# Patient Record
Sex: Female | Born: 1965 | Race: Black or African American | Hispanic: No | Marital: Single | Smoking: Former smoker
Health system: Southern US, Community
[De-identification: ages and names within clinical notes are randomized; demographics above are authoritative.]

## PROBLEM LIST (undated history)

## (undated) DIAGNOSIS — E119 Type 2 diabetes mellitus without complications: Secondary | ICD-10-CM

## (undated) DIAGNOSIS — I10 Essential (primary) hypertension: Secondary | ICD-10-CM

## (undated) DIAGNOSIS — Z87442 Personal history of urinary calculi: Secondary | ICD-10-CM

## (undated) DIAGNOSIS — E782 Mixed hyperlipidemia: Secondary | ICD-10-CM

## (undated) DIAGNOSIS — J302 Other seasonal allergic rhinitis: Secondary | ICD-10-CM

## (undated) HISTORY — DX: Type 2 diabetes mellitus without complications: E11.9

## (undated) HISTORY — DX: Mixed hyperlipidemia: E78.2

## (undated) HISTORY — PX: WISDOM TOOTH EXTRACTION: SHX21

## (undated) HISTORY — PX: TUBAL LIGATION: SHX77

## (undated) HISTORY — DX: Essential (primary) hypertension: I10

---

## 2010-05-02 ENCOUNTER — Emergency Department (HOSPITAL_COMMUNITY)
Admission: EM | Admit: 2010-05-02 | Discharge: 2010-05-02 | Payer: Self-pay | Source: Home / Self Care | Admitting: Emergency Medicine

## 2012-09-21 ENCOUNTER — Other Ambulatory Visit: Payer: Self-pay | Admitting: Obstetrics and Gynecology

## 2012-09-22 ENCOUNTER — Encounter (HOSPITAL_COMMUNITY): Payer: Self-pay | Admitting: Pharmacist

## 2012-09-23 ENCOUNTER — Encounter (HOSPITAL_COMMUNITY): Payer: Self-pay

## 2012-09-23 ENCOUNTER — Encounter (HOSPITAL_COMMUNITY)
Admission: RE | Admit: 2012-09-23 | Discharge: 2012-09-23 | Disposition: A | Discharge status: Home / Self Care | Payer: BC Managed Care – PPO | Source: Ambulatory Visit | Attending: Obstetrics and Gynecology | Admitting: Obstetrics and Gynecology

## 2012-09-23 DIAGNOSIS — Z01812 Encounter for preprocedural laboratory examination: Secondary | ICD-10-CM | POA: Insufficient documentation

## 2012-09-23 DIAGNOSIS — Z01818 Encounter for other preprocedural examination: Secondary | ICD-10-CM | POA: Insufficient documentation

## 2012-09-23 HISTORY — DX: Personal history of urinary calculi: Z87.442

## 2012-09-23 HISTORY — DX: Other seasonal allergic rhinitis: J30.2

## 2012-09-23 LAB — CBC
Hemoglobin: 11.6 g/dL — ABNORMAL LOW (ref 12.0–15.0)
MCHC: 31.3 g/dL (ref 30.0–36.0)
RBC: 4.48 MIL/uL (ref 3.87–5.11)

## 2012-09-23 LAB — SURGICAL PCR SCREEN: MRSA, PCR: POSITIVE — AB

## 2012-09-23 NOTE — Patient Instructions (Addendum)
   Your procedure is scheduled on: Thursday, May 29th  Enter through the Main Entrance of [facility] at: 6 am Pick up the phone at the desk and dial [phone] and inform us of your arrival.  Please call this number if you have any problems the morning of surgery: [phone]  Remember: Do not eat or drink after midnight: Wednesday Take these medicines the morning of surgery with a SIP OF WATER: None  Do not wear jewelry, make-up, or FINGER nail polish No metal in your hair or on your body. Do not wear lotions, powders, perfumes. You may wear deodorant.  Please use your CHG wash as directed prior to surgery.  Do not shave anywhere for at least 12 hours prior to first CHG shower.  Do not bring valuables to the hospital. Contacts, dentures or bridgework may not be worn into surgery.  Leave suitcase in the car. After Surgery it may be brought to your room. For patients being admitted to the hospital, checkout time is 11:00am the day of discharge.  Patients discharged on the day of surgery will not be allowed to drive home.  Home with sister Tammy.

## 2012-10-08 NOTE — H&P (Signed)
47 y.o. yo complains of severe dysmenorrhea.   From notes at office:"Pt referred by [facility] in [place] for a several yr h.o. worsening dysmenorrhea/ menorrhagia. Has treated this in past with OCPs/ Depo Provera. Had an u/s which revealed a normal endometrial strip and findings c/w adenomyosis. Pt has had a C/S secondary to preeclampsia and a BTL. Not currently sexually active. has normal GU/GI function."  Past Medical History  Diagnosis Date  . Seasonal allergies   . History of kidney stones     passes stone - no surgery required   Past Surgical History  Procedure Laterality Date  . Tubal ligation    . Cesarean section    . Wisdom tooth extraction      History   Social History  . Marital Status: Single    Spouse Name: N/A    Number of Children: N/A  . Years of Education: N/A   Occupational History  . Not on file.   Social History Main Topics  . Smoking status: Former Smoker -- 1.00 packs/day for 30 years    Types: Cigarettes    Quit date: 05/14/2012  . Smokeless tobacco: Never Used  . Alcohol Use: No  . Drug Use: No  . Sexually Active: Yes    Birth Control/ Protection: Surgical   Other Topics Concern  . Not on file   Social History Narrative  . No narrative on file    No current facility-administered medications on file prior to encounter.   No current outpatient prescriptions on file prior to encounter.    Allergies  Allergen Reactions  . Sulfa Antibiotics Hives    @VITALS2 @  Lungs: clear to ascultation Cor:  RRR Abdomen:  soft, nontender, nondistended. Ex:  no cords, erythema Pelvic: Vulva: no masses, atrophy, or lesions. Bladder/Urethra: no urethral discharge or mass and normal meatus and bladder non distended. Vagina no tenderness, erythema, cystocele, rectocele, abnormal vaginal discharge, or vesicle(s) or ulcers; sl narrow. Cervix: no discharge or cervical motion tenderness and grossly normal. Uterus: normal shape and size (8) and  midline, mobile, non-tender, and no uterine prolapse. Adnexa/Parametria: no parametrial tenderness or mass and no adnexal tenderness or ovarian mass.  [place] 12.9x6x8.  Adenomyosis.  No fibroids; ovaries normal  A:  Severe dysmenorrhea and menorrhagia not responsive to medical management.  Probable adenomyosis.  Desires definitive.   P:  For Robotic TLH/ salpingectomies/ possible BSO.   All risks, benefits and alternatives d/w patient and she desires to proceed.  Patient has undergone a modified bowel prep and will receive preop antibiotics and SCDs during the operation.     Finley Dinkel A

## 2012-10-09 ENCOUNTER — Encounter (HOSPITAL_COMMUNITY)
Admission: RE | Disposition: A | Discharge status: Home / Self Care | Payer: Self-pay | Source: Ambulatory Visit | Attending: Obstetrics and Gynecology

## 2012-10-09 ENCOUNTER — Encounter (HOSPITAL_COMMUNITY): Payer: Self-pay | Admitting: *Deleted

## 2012-10-09 ENCOUNTER — Encounter (HOSPITAL_COMMUNITY): Payer: Self-pay | Admitting: Anesthesiology

## 2012-10-09 ENCOUNTER — Ambulatory Visit (HOSPITAL_COMMUNITY)
Admission: RE | Admit: 2012-10-09 | Discharge: 2012-10-10 | Disposition: A | Discharge status: Home / Self Care | Payer: BC Managed Care – PPO | Source: Ambulatory Visit | Attending: Obstetrics and Gynecology | Admitting: Obstetrics and Gynecology

## 2012-10-09 ENCOUNTER — Ambulatory Visit (HOSPITAL_COMMUNITY): Payer: BC Managed Care – PPO | Admitting: Anesthesiology

## 2012-10-09 DIAGNOSIS — Z23 Encounter for immunization: Secondary | ICD-10-CM | POA: Insufficient documentation

## 2012-10-09 DIAGNOSIS — Z9889 Other specified postprocedural states: Secondary | ICD-10-CM

## 2012-10-09 DIAGNOSIS — N8000 Endometriosis of the uterus, unspecified: Secondary | ICD-10-CM | POA: Insufficient documentation

## 2012-10-09 DIAGNOSIS — N920 Excessive and frequent menstruation with regular cycle: Secondary | ICD-10-CM | POA: Insufficient documentation

## 2012-10-09 DIAGNOSIS — N946 Dysmenorrhea, unspecified: Secondary | ICD-10-CM | POA: Insufficient documentation

## 2012-10-09 HISTORY — PX: ROBOTIC ASSISTED TOTAL HYSTERECTOMY: SHX6085

## 2012-10-09 LAB — PREGNANCY, URINE: Preg Test, Ur: NEGATIVE

## 2012-10-09 SURGERY — ROBOTIC ASSISTED TOTAL HYSTERECTOMY
Anesthesia: General | Site: Abdomen | Wound class: Clean Contaminated

## 2012-10-09 MED ORDER — GLYCOPYRROLATE 0.2 MG/ML IJ SOLN
INTRAMUSCULAR | Status: AC
  Filled 2012-10-09: qty 3

## 2012-10-09 MED ORDER — ZOLPIDEM TARTRATE 5 MG PO TABS: 5.0000 mg | ORAL_TABLET | Freq: Every evening | ORAL | Status: DC | PRN

## 2012-10-09 MED ORDER — FENTANYL CITRATE 0.05 MG/ML IJ SOLN
INTRAMUSCULAR | Status: DC | PRN
  Administered 2012-10-09 (×2): 100 ug via INTRAVENOUS
  Administered 2012-10-09: 50 ug via INTRAVENOUS
  Administered 2012-10-09: 100 ug via INTRAVENOUS

## 2012-10-09 MED ORDER — NEOSTIGMINE METHYLSULFATE 1 MG/ML IJ SOLN
INTRAMUSCULAR | Status: AC
  Filled 2012-10-09: qty 1

## 2012-10-09 MED ORDER — PROPOFOL 10 MG/ML IV BOLUS
INTRAVENOUS | Status: DC | PRN
  Administered 2012-10-09: 170 mg via INTRAVENOUS
  Administered 2012-10-09: 60 mg via INTRAVENOUS
  Administered 2012-10-09: 30 mg via INTRAVENOUS

## 2012-10-09 MED ORDER — ONDANSETRON HCL 4 MG/2ML IJ SOLN
INTRAMUSCULAR | Status: AC
  Filled 2012-10-09: qty 2

## 2012-10-09 MED ORDER — NEOSTIGMINE METHYLSULFATE 1 MG/ML IJ SOLN
INTRAMUSCULAR | Status: DC | PRN
  Administered 2012-10-09: 5 mg via INTRAVENOUS

## 2012-10-09 MED ORDER — SCOPOLAMINE 1 MG/3DAYS TD PT72
MEDICATED_PATCH | TRANSDERMAL | Status: AC
  Filled 2012-10-09: qty 1

## 2012-10-09 MED ORDER — INDIGOTINDISULFONATE SODIUM 8 MG/ML IJ SOLN
INTRAMUSCULAR | Status: DC | PRN
  Administered 2012-10-09: 3 mL via INTRAVENOUS

## 2012-10-09 MED ORDER — ROCURONIUM BROMIDE 50 MG/5ML IV SOLN
INTRAVENOUS | Status: AC
  Filled 2012-10-09: qty 2

## 2012-10-09 MED ORDER — ROCURONIUM BROMIDE 100 MG/10ML IV SOLN
INTRAVENOUS | Status: DC | PRN
  Administered 2012-10-09: 10 mg via INTRAVENOUS
  Administered 2012-10-09: 50 mg via INTRAVENOUS
  Administered 2012-10-09: 10 mg via INTRAVENOUS
  Administered 2012-10-09: 20 mg via INTRAVENOUS
  Administered 2012-10-09: 10 mg via INTRAVENOUS

## 2012-10-09 MED ORDER — FENTANYL CITRATE 0.05 MG/ML IJ SOLN
25.0000 ug | INTRAMUSCULAR | Status: DC | PRN
  Administered 2012-10-09: 25 ug via INTRAVENOUS

## 2012-10-09 MED ORDER — OXYCODONE-ACETAMINOPHEN 5-325 MG PO TABS
1.0000 | ORAL_TABLET | ORAL | Status: DC | PRN
  Administered 2012-10-09: 2 via ORAL
  Administered 2012-10-10: 1 via ORAL
  Administered 2012-10-10: 2 via ORAL
  Filled 2012-10-09 (×3): qty 2

## 2012-10-09 MED ORDER — PROPOFOL 10 MG/ML IV EMUL
INTRAVENOUS | Status: AC
  Filled 2012-10-09: qty 20

## 2012-10-09 MED ORDER — MIDAZOLAM HCL 2 MG/2ML IJ SOLN
INTRAMUSCULAR | Status: AC
  Filled 2012-10-09: qty 2

## 2012-10-09 MED ORDER — KETOROLAC TROMETHAMINE 30 MG/ML IJ SOLN: 30.0000 mg | Freq: Four times a day (QID) | INTRAMUSCULAR | Status: DC

## 2012-10-09 MED ORDER — LIDOCAINE HCL (CARDIAC) 20 MG/ML IV SOLN
INTRAVENOUS | Status: DC | PRN
  Administered 2012-10-09: 80 mg via INTRAVENOUS

## 2012-10-09 MED ORDER — DEXAMETHASONE SODIUM PHOSPHATE 10 MG/ML IJ SOLN
INTRAMUSCULAR | Status: AC
  Filled 2012-10-09: qty 1

## 2012-10-09 MED ORDER — IBUPROFEN 800 MG PO TABS
800.0000 mg | ORAL_TABLET | Freq: Three times a day (TID) | ORAL | Status: DC | PRN
  Administered 2012-10-10: 800 mg via ORAL
  Filled 2012-10-09: qty 1

## 2012-10-09 MED ORDER — DEXAMETHASONE SODIUM PHOSPHATE 10 MG/ML IJ SOLN
INTRAMUSCULAR | Status: DC | PRN
  Administered 2012-10-09: 10 mg via INTRAVENOUS

## 2012-10-09 MED ORDER — CHLORHEXIDINE GLUCONATE CLOTH 2 % EX PADS
6.0000 | MEDICATED_PAD | Freq: Every day | CUTANEOUS | Status: DC
  Administered 2012-10-10: 6 via TOPICAL

## 2012-10-09 MED ORDER — OFLOXACIN 0.3 % OP SOLN: 1.0000 [drp] | Freq: Four times a day (QID) | OPHTHALMIC | Status: DC

## 2012-10-09 MED ORDER — ONDANSETRON HCL 4 MG/2ML IJ SOLN
INTRAMUSCULAR | Status: DC | PRN
  Administered 2012-10-09: 4 mg via INTRAVENOUS

## 2012-10-09 MED ORDER — MIDAZOLAM HCL 5 MG/5ML IJ SOLN
INTRAMUSCULAR | Status: DC | PRN
  Administered 2012-10-09: 2 mg via INTRAVENOUS

## 2012-10-09 MED ORDER — GLYCOPYRROLATE 0.2 MG/ML IJ SOLN
INTRAMUSCULAR | Status: AC
  Filled 2012-10-09: qty 1

## 2012-10-09 MED ORDER — MENTHOL 3 MG MT LOZG: 1.0000 | LOZENGE | OROMUCOSAL | Status: DC | PRN

## 2012-10-09 MED ORDER — MONTELUKAST SODIUM 10 MG PO TABS
10.0000 mg | ORAL_TABLET | Freq: Every day | ORAL | Status: DC
  Administered 2012-10-09: 10 mg via ORAL
  Filled 2012-10-09: qty 1

## 2012-10-09 MED ORDER — KETOROLAC TROMETHAMINE 0.5 % OP SOLN
1.0000 [drp] | Freq: Four times a day (QID) | OPHTHALMIC | Status: DC
  Administered 2012-10-09: 1 [drp] via OPHTHALMIC
  Filled 2012-10-09: qty 3

## 2012-10-09 MED ORDER — FENTANYL CITRATE 0.05 MG/ML IJ SOLN
INTRAMUSCULAR | Status: AC
  Filled 2012-10-09: qty 2

## 2012-10-09 MED ORDER — FENTANYL CITRATE 0.05 MG/ML IJ SOLN
INTRAMUSCULAR | Status: AC
  Administered 2012-10-09: 25 ug via INTRAVENOUS
  Filled 2012-10-09: qty 2

## 2012-10-09 MED ORDER — PNEUMOCOCCAL VAC POLYVALENT 25 MCG/0.5ML IJ INJ
0.5000 mL | INJECTION | INTRAMUSCULAR | Status: AC
  Administered 2012-10-10: 0.5 mL via INTRAMUSCULAR
  Filled 2012-10-09: qty 0.5

## 2012-10-09 MED ORDER — ACETAMINOPHEN 10 MG/ML IV SOLN
INTRAVENOUS | Status: AC
  Administered 2012-10-09: 1000 mg via INTRAVENOUS
  Filled 2012-10-09: qty 100

## 2012-10-09 MED ORDER — LIDOCAINE HCL (CARDIAC) 20 MG/ML IV SOLN
INTRAVENOUS | Status: AC
  Filled 2012-10-09: qty 5

## 2012-10-09 MED ORDER — ACETAMINOPHEN 10 MG/ML IV SOLN
INTRAVENOUS | Status: DC | PRN
  Administered 2012-10-09: 1000 mg via INTRAVENOUS

## 2012-10-09 MED ORDER — KETOROLAC TROMETHAMINE 30 MG/ML IJ SOLN
INTRAMUSCULAR | Status: AC
  Administered 2012-10-09: 30 mg via INTRAVENOUS
  Filled 2012-10-09: qty 1

## 2012-10-09 MED ORDER — BUPIVACAINE LIPOSOME 1.3 % IJ SUSP
20.0000 mL | Freq: Once | INTRAMUSCULAR | Status: DC
  Filled 2012-10-09: qty 20

## 2012-10-09 MED ORDER — ACETAMINOPHEN 10 MG/ML IV SOLN
1000.0000 mg | Freq: Once | INTRAVENOUS | Status: AC
  Administered 2012-10-09: 1000 mg via INTRAVENOUS

## 2012-10-09 MED ORDER — ONDANSETRON HCL 4 MG PO TABS: 4.0000 mg | ORAL_TABLET | Freq: Four times a day (QID) | ORAL | Status: DC | PRN

## 2012-10-09 MED ORDER — FENTANYL CITRATE 0.05 MG/ML IJ SOLN
INTRAMUSCULAR | Status: AC
  Filled 2012-10-09: qty 5

## 2012-10-09 MED ORDER — GLYCOPYRROLATE 0.2 MG/ML IJ SOLN
INTRAMUSCULAR | Status: DC | PRN
  Administered 2012-10-09: .8 mg via INTRAVENOUS

## 2012-10-09 MED ORDER — CEFAZOLIN SODIUM-DEXTROSE 2-3 GM-% IV SOLR
INTRAVENOUS | Status: AC
  Filled 2012-10-09: qty 50

## 2012-10-09 MED ORDER — ONDANSETRON HCL 4 MG/2ML IJ SOLN: 4.0000 mg | Freq: Four times a day (QID) | INTRAMUSCULAR | Status: DC | PRN

## 2012-10-09 MED ORDER — PHENYLEPHRINE HCL 10 MG/ML IJ SOLN
INTRAMUSCULAR | Status: DC | PRN
  Administered 2012-10-09: 40 ug via INTRAVENOUS
  Administered 2012-10-09: 80 ug via INTRAVENOUS
  Administered 2012-10-09: 40 ug via INTRAVENOUS

## 2012-10-09 MED ORDER — LACTATED RINGERS IV SOLN
INTRAVENOUS | Status: DC
  Administered 2012-10-09: 1000 mL via INTRAVENOUS
  Administered 2012-10-09 (×2): via INTRAVENOUS

## 2012-10-09 MED ORDER — LACTATED RINGERS IR SOLN
Status: DC | PRN
  Administered 2012-10-09: 3000 mL

## 2012-10-09 MED ORDER — CEFAZOLIN SODIUM-DEXTROSE 2-3 GM-% IV SOLR
2.0000 g | INTRAVENOUS | Status: AC
  Administered 2012-10-09: 2 g via INTRAVENOUS

## 2012-10-09 MED ORDER — KETOROLAC TROMETHAMINE 30 MG/ML IJ SOLN: 15.0000 mg | Freq: Once | INTRAMUSCULAR | Status: DC | PRN

## 2012-10-09 MED ORDER — ACETAMINOPHEN 10 MG/ML IV SOLN: 1000.0000 mg | Freq: Once | INTRAVENOUS | Status: AC

## 2012-10-09 MED ORDER — ACETAMINOPHEN 10 MG/ML IV SOLN
INTRAVENOUS | Status: AC
  Filled 2012-10-09: qty 100

## 2012-10-09 SURGICAL SUPPLY — 76 items
BAG URINE DRAINAGE (UROLOGICAL SUPPLIES) ×2 IMPLANT
BARRIER ADHS 3X4 INTERCEED (GAUZE/BANDAGES/DRESSINGS) ×2 IMPLANT
BENZOIN TINCTURE PRP APPL 2/3 (GAUZE/BANDAGES/DRESSINGS) ×2 IMPLANT
CATH FOLEY 3WAY  5CC 16FR (CATHETERS) ×1
CATH FOLEY 3WAY 5CC 16FR (CATHETERS) ×1 IMPLANT
CHLORAPREP W/TINT 26ML (MISCELLANEOUS) ×4 IMPLANT
CLOTH BEACON ORANGE TIMEOUT ST (SAFETY) ×2 IMPLANT
CONT PATH 16OZ SNAP LID 3702 (MISCELLANEOUS) ×2 IMPLANT
COVER MAYO STAND STRL (DRAPES) ×2 IMPLANT
COVER TABLE BACK 60X90 (DRAPES) ×4 IMPLANT
COVER TIP SHEARS 8 DVNC (MISCELLANEOUS) ×2 IMPLANT
COVER TIP SHEARS 8MM DA VINCI (MISCELLANEOUS) ×2
DECANTER SPIKE VIAL GLASS SM (MISCELLANEOUS) ×2 IMPLANT
DERMABOND ADVANCED (GAUZE/BANDAGES/DRESSINGS) ×1
DERMABOND ADVANCED .7 DNX12 (GAUZE/BANDAGES/DRESSINGS) ×1 IMPLANT
DRAPE HUG U DISPOSABLE (DRAPE) ×2 IMPLANT
DRAPE LG THREE QUARTER DISP (DRAPES) ×4 IMPLANT
DRAPE WARM FLUID 44X44 (DRAPE) ×2 IMPLANT
ELECT REM PT RETURN 9FT ADLT (ELECTROSURGICAL) ×2
ELECTRODE REM PT RTRN 9FT ADLT (ELECTROSURGICAL) ×1 IMPLANT
EVACUATOR SMOKE 8.L (FILTER) ×4 IMPLANT
GAUZE VASELINE 3X9 (GAUZE/BANDAGES/DRESSINGS) IMPLANT
GLOVE BIO SURGEON STRL SZ 6.5 (GLOVE) ×2 IMPLANT
GLOVE BIO SURGEON STRL SZ7 (GLOVE) ×4 IMPLANT
GLOVE BIOGEL PI IND STRL 6.5 (GLOVE) ×1 IMPLANT
GLOVE BIOGEL PI INDICATOR 6.5 (GLOVE) ×1
GLOVE ECLIPSE 6.5 STRL STRAW (GLOVE) ×8 IMPLANT
GLOVE ECLIPSE 7.0 STRL STRAW (GLOVE) ×6 IMPLANT
GLOVE SKINSENSE NS SZ7.5 (GLOVE) ×2
GLOVE SKINSENSE STRL SZ7.5 (GLOVE) ×2 IMPLANT
GOWN STRL REIN XL XLG (GOWN DISPOSABLE) ×12 IMPLANT
GYRUS RUMI II 2.5CM BLUE (DISPOSABLE) ×2
GYRUS RUMI II 3.5CM BLUE (DISPOSABLE)
GYRUS RUMI II 4.0CM BLUE (DISPOSABLE)
KIT ACCESSORY DA VINCI DISP (KITS) ×1
KIT ACCESSORY DVNC DISP (KITS) ×1 IMPLANT
LEGGING LITHOTOMY PAIR STRL (DRAPES) ×2 IMPLANT
MANIPULATOR UTERINE 4.5 ZUMI (MISCELLANEOUS) IMPLANT
NEEDLE INSUFFLATION 120MM (ENDOMECHANICALS) ×2 IMPLANT
OCCLUDER COLPOPNEUMO (BALLOONS) ×2 IMPLANT
PACK LAVH (CUSTOM PROCEDURE TRAY) ×2 IMPLANT
PAD PREP 24X48 CUFFED NSTRL (MISCELLANEOUS) ×4 IMPLANT
PLUG CATH AND CAP STER (CATHETERS) ×2 IMPLANT
PROTECTOR NERVE ULNAR (MISCELLANEOUS) ×4 IMPLANT
RUMI II 3.0CM BLUE KOH-EFFICIE (DISPOSABLE) IMPLANT
RUMI II GYRUS 2.5CM BLUE (DISPOSABLE) ×1 IMPLANT
RUMI II GYRUS 3.5CM BLUE (DISPOSABLE) IMPLANT
RUMI II GYRUS 4.0CM BLUE (DISPOSABLE) IMPLANT
SET CYSTO W/LG BORE CLAMP LF (SET/KITS/TRAYS/PACK) IMPLANT
SET IRRIG TUBING LAPAROSCOPIC (IRRIGATION / IRRIGATOR) ×2 IMPLANT
SOLUTION ELECTROLUBE (MISCELLANEOUS) ×2 IMPLANT
STRIP CLOSURE SKIN 1/2X4 (GAUZE/BANDAGES/DRESSINGS) ×2 IMPLANT
SUT VIC AB 0 CT1 27 (SUTURE)
SUT VIC AB 0 CT1 27XBRD ANBCTR (SUTURE) IMPLANT
SUT VIC AB 2-0 CT2 27 (SUTURE) ×4 IMPLANT
SUT VICRYL 0 UR6 27IN ABS (SUTURE) ×2 IMPLANT
SUT VICRYL RAPIDE 3 0 (SUTURE) ×4 IMPLANT
SUT VLOC 180 0 9IN  GS21 (SUTURE) ×1
SUT VLOC 180 0 9IN GS21 (SUTURE) ×1 IMPLANT
SYR 50ML LL SCALE MARK (SYRINGE) ×2 IMPLANT
SYSTEM CONVERTIBLE TROCAR (TROCAR) IMPLANT
TIP RUMI ORANGE 6.7MMX12CM (TIP) IMPLANT
TIP UTERINE 5.1X6CM LAV DISP (MISCELLANEOUS) ×2 IMPLANT
TIP UTERINE 6.7X10CM GRN DISP (MISCELLANEOUS) ×2 IMPLANT
TIP UTERINE 6.7X6CM WHT DISP (MISCELLANEOUS) IMPLANT
TIP UTERINE 6.7X8CM BLUE DISP (MISCELLANEOUS) IMPLANT
TOWEL OR 17X24 6PK STRL BLUE (TOWEL DISPOSABLE) ×4 IMPLANT
TROCAR DILATING TIP 12MM 150MM (ENDOMECHANICALS) ×2 IMPLANT
TROCAR DISP BLADELESS 8 DVNC (TROCAR) ×1 IMPLANT
TROCAR DISP BLADELESS 8MM (TROCAR) ×1
TROCAR XCEL 12X100 BLDLESS (ENDOMECHANICALS) IMPLANT
TROCAR XCEL NON-BLD 5MMX100MML (ENDOMECHANICALS) ×2 IMPLANT
TUBING CONNECTING 10 (TUBING) ×2 IMPLANT
TUBING FILTER THERMOFLATOR (ELECTROSURGICAL) ×2 IMPLANT
WATER STERILE IRR 1000ML POUR (IV SOLUTION) ×6 IMPLANT
YANKAUER SUCT BULB TIP NO VENT (SUCTIONS) ×2 IMPLANT

## 2012-10-09 NOTE — Anesthesia Preprocedure Evaluation (Addendum)
Anesthesia Evaluation  Patient identified by MRN, date of birth, ID band Patient awake    Reviewed: Allergy & Precautions, H&P , NPO status , Patient's Chart, lab work & pertinent test results  Airway Mallampati: III TM Distance: >3 FB Neck ROM: Full    Dental  (+) Dental Advisory Given and Teeth Intact   Pulmonary former smoker,  breath sounds clear to auscultation        Cardiovascular negative cardio ROS  Rhythm:Regular Rate:Normal     Neuro/Psych negative neurological ROS  negative psych ROS   GI/Hepatic negative GI ROS, Neg liver ROS,   Endo/Other  negative endocrine ROS  Renal/GU negative Renal ROS     Musculoskeletal negative musculoskeletal ROS (+)   Abdominal (+) + obese,   Peds  Hematology negative hematology ROS (+)   Anesthesia Other Findings   Reproductive/Obstetrics negative OB ROS                          Anesthesia Physical Anesthesia Plan  ASA: II  Anesthesia Plan: General   Post-op Pain Management:    Induction: Intravenous  Airway Management Planned: Oral ETT  Additional Equipment:   Intra-op Plan:   Post-operative Plan: Extubation in OR  Informed Consent: I have reviewed the patients History and Physical, chart, labs and discussed the procedure including the risks, benefits and alternatives for the proposed anesthesia with the patient or authorized representative who has indicated his/her understanding and acceptance.   Dental advisory given  Plan Discussed with: CRNA  Anesthesia Plan Comments:         Anesthesia Quick Evaluation

## 2012-10-09 NOTE — Transfer of Care (Signed)
Immediate Anesthesia Transfer of Care Note  Patient: Nicole Roth  Procedure(s) Performed: Procedure(s): ROBOTIC ASSISTED TOTAL HYSTERECTOMY (N/A)  Patient Location: PACU  Anesthesia Type:General  Level of Consciousness: awake, alert  and oriented  Airway & Oxygen Therapy: Patient Spontanous Breathing and Patient connected to nasal cannula oxygen  Post-op Assessment: Report given to PACU RN and Post -op Vital signs reviewed and stable  Post vital signs: Reviewed and stable  Complications: No apparent anesthesia complications

## 2012-10-09 NOTE — Anesthesia Procedure Notes (Addendum)
Procedure Name: Intubation Date/Time: 10/09/2012 7:37 AM Performed by: Graciela Husbands Pre-anesthesia Checklist: Suction available, Emergency Drugs available, Timeout performed, Patient being monitored and Patient identified Patient Re-evaluated:Patient Re-evaluated prior to inductionOxygen Delivery Method: Circle system utilized Preoxygenation: Pre-oxygenation with 100% oxygen Intubation Type: IV induction Ventilation: Two handed mask ventilation required, Oral airway inserted - appropriate to patient size and Mask ventilation with difficulty Laryngoscope Size: Miller, 2, Mac and 3 Grade View: Grade IV Tube type: Oral Tube size: 7.0 mm Number of attempts: 3 Airway Equipment and Method: Patient positioned with wedge pillow,  Stylet and Video-laryngoscopy Placement Confirmation: ETT inserted through vocal cords under direct vision,  positive ETCO2 and breath sounds checked- equal and bilateral Secured at: 22 cm Tube secured with: Tape Dental Injury: Teeth and Oropharynx as per pre-operative assessment  Difficulty Due To: Difficult Airway- due to anterior larynx and Difficult Airway- due to limited oral opening Future Recommendations: Recommend- induction with short-acting agent, and alternative techniques readily available Comments: See note by A. Rodman Pickle MD,

## 2012-10-09 NOTE — Op Note (Signed)
10/09/2012  11:40 AM  PATIENT:  Nicole Roth  47 y.o. female  PRE-OPERATIVE DIAGNOSIS:  PELVIC PAIN / ADENOMYOSIS  POST-OPERATIVE DIAGNOSIS:  PELVIC PAIN / ADENOMYOSIS  PROCEDURE:  Procedure(s): ROBOTIC ASSISTED TOTAL HYSTERECTOMY (N/A)  SURGEON:  Surgeon(s) and Role:    * Camari Quintanilla A Anthonella Klausner, MD - Primary    * Mark E Anderson, MD - Assisting   ANESTHESIA:   general  EBL:  Total I/O In: 1000 [I.V.:1000] Out: 500 [Urine:300; Blood:200]  BLOOD ADMINISTERED:none   SPECIMEN:  Source of Specimen:  uterus, cervix and bilateral tubes.  DISPOSITION OF SPECIMEN:  PATHOLOGY  COUNTS:  YES  TOURNIQUET:  * No tourniquets in log *  DICTATION: .Note written in EPIC  PLAN OF CARE: Admit for overnight observation  PATIENT DISPOSITION:  PACU - hemodynamically stable.   Delay start of Pharmacological VTE agent (>24hrs) due to surgical blood loss or risk of bleeding: not applicable  Complications:  None.  Findings:  13 weeks size uterus which was very boggy.  The R and L ovaries appeared grossly normal but the tubes were dilated and curled and adhesed to the ovaries.  The ureters were identified during multiple points of the case and were always out of the field of dissection.  On cystoscopy, the bladder was intact and bilateral spill was seen from each ureteral orriface.    Medications:  Ancef.  Indigo carmine.  Exporelle  Technique:  After adequate anesthesia was achieved the patient was positioned, prepped and draped in usual sterile fashion.  A speculum was placed in the vagina and the cervix dilated with pratt dilators.   At first a 6 cm rumi was introduced but did not allow for the manipulation of the uterus.  After sounding again, the uterus was found to be 11 cm.  The 10 cm Rumi and 2.5 cm Koh ring were then assembled and placed in proper fashion.  The  Speculum was removed and the bladder catheterized with a foley.    Attention was turned to the abdomen where a 1 cm incision was  made 1 cm above the umbilicus.  The veress needle was introduced without aspiration of bowel contents or blood and the abdomen insufflated. The long 12 mm trocar was placed and the other three trocar sites were marked out, all approximately 10 cm from each other and the camera.  Two 8.5 mm trocars were placed on either side of the camera port and a 5 mm assistant port was placed 3 cm above the L iliac crest.  All trocars were inserted under direct visualization of the camera.  The patient was placed in trendelenburg and then the Robot docked.  The PK forceps were placed on arm 2 and the Hot shears on arm 1 and introduced under direct visualization of the camera.  I then broke scrub and sat down at the console.  The above findings were noted and the ureters identified well out of the field of dissection.  The round ligament on the R was cauterized and the anterior peritoneum opened. The bladder was then able to be retracted inferiorly and the vesico-uterine fascia was incised in the midline until the bladder was removed one cm below the Koh ring.   The tubes were curled on and densely adhesed to the ovary so they were temporarily left in place .  The Utero-ovarian ligament was then divided with the PK cautery and shears.  The posterior broad ligament was then divided with the hot shears until the uterosacral   ligament.  The Broad and cardinal ligaments were then cauterized against the cervix to the level of the Koh ring, securing the uterine artery.  Each pedicle was then incised with the shears.  The anterior leaf was then incised at the reflection of the vessico-uterine junction and the lateral bladder retracted inferiorly after the round ligament had been divided with the PK forceps on the left side.   The left utero-ovarian ligament divided with the PK forceps and the scissors.  The round ligament was divided as well and the posterior leaf of the broad ligament then divided with the hot shears. The broad and  cardinal ligaments were then cauterized on the left in the same way.   At the level of the internal os, the uterine arteries were bilaterally cauterized with the PK.  The ureters were identified well out of the field of dissection.  .   The hot shears then circumferentially incised the vagina at the level of the reflection on the Koh ring.  Once the uterus and cervix were amputated, cautery was used to insure hemostasis of the pedicles.  The uterus was then removed in pieces through the vagina until the bulk of the fundus fit though.  The Robot was undocked at this time to allow better access and also allow the anesthesiologists to ventilate the patient easier.  After the uterus was removed, the Robot was redocked and the same instruments put back in.  Cautery was used to ensure hemostasis of the left pedicles very superficially.  The ureters were peristalsing bilaterally well and very lateral to the areas of operation.   The PK and Hot shears were reintroduced and the ovaries grasped and elevated.  The bipolar shears were then used to remove the tubes from each ovary and these were then pushed out the vagina and sent to pathology.  A small piece of vaginal tissue at the angle was trimmed and removed.   A small defect in the posterior vagina was noted at this point which was hemostatic and probably caused by the malposition of the first Rumi.    Once hemostasis was achieved, the instruments were changed to the long forceps and mega suture cut needle driver and the cuff was closed with a running stitch of 0-vicryl V loc.    The Robot was then undocked and I scrubbed back in.    The skin incisions were closed with subcuticular stitches of 3-0 vicryl Rapide and Dermabond.  All instruments were removed from the vagina and cystoscopy performed, revealing an intact bladder and vigourous spill of indigo carmine from each ureteral orifice.  The cystoscope was removed and the cuff felt to be intact.  The small vaginal  defect was identified and repaired with a 2-0 vicryl figure of eight.  A superficial abrasion of the vagina was also closed with a figure of eight.    A last look inside revealed good hemostasis.  The Exporelle was injected into the pelvis.  All instruments were removed from the abdomen and the abdomen was desufflated.  The 12 mm scope fascia was closed with a figure of eight stitch of 2-0 vicryl.  The skin incisions were closed with 3-0 Vicryl R and dermabond.   The patient taken to the recovery room in stable condition.  Natanel Snavely A   

## 2012-10-09 NOTE — Anesthesia Postprocedure Evaluation (Signed)
  Anesthesia Post-op Note  Anesthesia Post Note  Patient: Nicole Roth  Procedure(s) Performed: Procedure(s) (LRB): ROBOTIC ASSISTED TOTAL HYSTERECTOMY (N/A)  Anesthesia type: General  Patient location: PACU  Post pain: Pain level controlled  Post assessment: Post-op Vital signs reviewed  Last Vitals:  Filed Vitals:   10/09/12 1415  BP: 155/80  Pulse: 107  Temp: 36.7 C  Resp: 18    Post vital signs: Reviewed  Level of consciousness: sedated  Complications: No apparent anesthesia complications.  Patient arrived from OR with usual corneal and facial edema.  Just prior to going to floor she noted pain in right eye and a sensation of foreign body there.  Eye flushed with saline.  Her conjunctiva is red, no foreign body seen.  Likely corneal abrasion.  Will patch eye for comfort.  Jasmine December, MD

## 2012-10-09 NOTE — Brief Op Note (Signed)
10/09/2012  11:40 AM  PATIENT:  Nicole Roth  47 y.o. female  PRE-OPERATIVE DIAGNOSIS:  PELVIC PAIN / ADENOMYOSIS  POST-OPERATIVE DIAGNOSIS:  PELVIC PAIN / ADENOMYOSIS  PROCEDURE:  Procedure(s): ROBOTIC ASSISTED TOTAL HYSTERECTOMY (N/A)  SURGEON:  Surgeon(s) and Role:    * Loney Laurence, MD - Primary    * Levi Aland, MD - Assisting   ANESTHESIA:   general  EBL:  Total I/O In: 1000 [I.V.:1000] Out: 500 [Urine:300; Blood:200]  BLOOD ADMINISTERED:none   SPECIMEN:  Source of Specimen:  uterus, cervix and bilateral tubes.  DISPOSITION OF SPECIMEN:  PATHOLOGY  COUNTS:  YES  TOURNIQUET:  * No tourniquets in log *  DICTATION: .Note written in EPIC  PLAN OF CARE: Admit for overnight observation  PATIENT DISPOSITION:  PACU - hemodynamically stable.   Delay start of Pharmacological VTE agent (>24hrs) due to surgical blood loss or risk of bleeding: not applicable  Complications:  None.  Findings:  13 weeks size uterus which was very boggy.  The R and L ovaries appeared grossly normal but the tubes were dilated and curled and adhesed to the ovaries.  The ureters were identified during multiple points of the case and were always out of the field of dissection.  On cystoscopy, the bladder was intact and bilateral spill was seen from each ureteral orriface.    Medications:  Ancef.  Indigo carmine.  Exporelle  Technique:  After adequate anesthesia was achieved the patient was positioned, prepped and draped in usual sterile fashion.  A speculum was placed in the vagina and the cervix dilated with pratt dilators.   At first a 6 cm rumi was introduced but did not allow for the manipulation of the uterus.  After sounding again, the uterus was found to be 11 cm.  The 10 cm Rumi and 2.5 cm Koh ring were then assembled and placed in proper fashion.  The  Speculum was removed and the bladder catheterized with a foley.    Attention was turned to the abdomen where a 1 cm incision was  made 1 cm above the umbilicus.  The veress needle was introduced without aspiration of bowel contents or blood and the abdomen insufflated. The long 12 mm trocar was placed and the other three trocar sites were marked out, all approximately 10 cm from each other and the camera.  Two 8.5 mm trocars were placed on either side of the camera port and a 5 mm assistant port was placed 3 cm above the L iliac crest.  All trocars were inserted under direct visualization of the camera.  The patient was placed in trendelenburg and then the Robot docked.  The PK forceps were placed on arm 2 and the Hot shears on arm 1 and introduced under direct visualization of the camera.  I then broke scrub and sat down at the console.  The above findings were noted and the ureters identified well out of the field of dissection.  The round ligament on the R was cauterized and the anterior peritoneum opened. The bladder was then able to be retracted inferiorly and the vesico-uterine fascia was incised in the midline until the bladder was removed one cm below the Koh ring.   The tubes were curled on and densely adhesed to the ovary so they were temporarily left in place .  The Utero-ovarian ligament was then divided with the PK cautery and shears.  The posterior broad ligament was then divided with the hot shears until the uterosacral  ligament.  The Broad and cardinal ligaments were then cauterized against the cervix to the level of the Koh ring, securing the uterine artery.  Each pedicle was then incised with the shears.  The anterior leaf was then incised at the reflection of the vessico-uterine junction and the lateral bladder retracted inferiorly after the round ligament had been divided with the PK forceps on the left side.   The left utero-ovarian ligament divided with the PK forceps and the scissors.  The round ligament was divided as well and the posterior leaf of the broad ligament then divided with the hot shears. The broad and  cardinal ligaments were then cauterized on the left in the same way.   At the level of the internal os, the uterine arteries were bilaterally cauterized with the PK.  The ureters were identified well out of the field of dissection.  .   The hot shears then circumferentially incised the vagina at the level of the reflection on the [facility] ring.  Once the uterus and cervix were amputated, cautery was used to insure hemostasis of the pedicles.  The uterus was then removed in pieces through the vagina until the bulk of the fundus fit though.  The Robot was undocked at this time to allow better access and also allow the anesthesiologists to ventilate the patient easier.  After the uterus was removed, the Robot was redocked and the same instruments put back in.  Cautery was used to ensure hemostasis of the left pedicles very superficially.  The ureters were peristalsing bilaterally well and very lateral to the areas of operation.   The PK and Hot shears were reintroduced and the ovaries grasped and elevated.  The bipolar shears were then used to remove the tubes from each ovary and these were then pushed out the vagina and sent to pathology.  A small piece of vaginal tissue at the angle was trimmed and removed.   A small defect in the posterior vagina was noted at this point which was hemostatic and probably caused by the malposition of the first Rumi.    Once hemostasis was achieved, the instruments were changed to the long forceps and mega suture cut needle driver and the cuff was closed with a running stitch of 0-vicryl V loc.    The Robot was then undocked and I scrubbed back in.    The skin incisions were closed with subcuticular stitches of 3-0 vicryl Rapide and Dermabond.  All instruments were removed from the vagina and cystoscopy performed, revealing an intact bladder and vigourous spill of indigo carmine from each ureteral orifice.  The cystoscope was removed and the cuff felt to be intact.  The small vaginal  defect was identified and repaired with a 2-0 vicryl figure of eight.  A superficial abrasion of the vagina was also closed with a figure of eight.    A last look inside revealed good hemostasis.  The Exporelle was injected into the pelvis.  All instruments were removed from the abdomen and the abdomen was desufflated.  The 12 mm scope fascia was closed with a figure of eight stitch of 2-0 vicryl.  The skin incisions were closed with 3-0 Vicryl R and dermabond.   The patient taken to the recovery room in stable condition.  Labrandon Knoch A

## 2012-10-09 NOTE — Progress Notes (Signed)
There has been no change in the patients history, status or exam since the history and physical.  Filed Vitals:   10/09/12 0609  BP: 172/96  Temp: 97.9 F (36.6 C)  TempSrc: Oral  Resp: 20    Lab Results  Component Value Date   WBC 14.6* 09/23/2012   HGB 11.6* 09/23/2012   HCT 37.1 09/23/2012   MCV 82.8 09/23/2012   PLT 315 09/23/2012   UPT neg Kemet Nijjar A

## 2012-10-09 NOTE — Progress Notes (Signed)
Patient is eating, ambulating, and voiding.  Pain control is good. Pt c/o pain in her eye that feels like something in it.  It was flushed but still aggravating.  Pt's eye were well protected during surgery.  BP 165/79  Pulse 101  Temp(Src) 98 F (36.7 C) (Oral)  Resp 20  Ht 4\' 10"  (1.473 m)  Wt 83.915 kg (185 lb)  BMI 38.68 kg/m2  SpO2 100%  lungs:   clear to auscultation cor:    RRR Abdomen:  soft, appropriate tenderness, incisions intact and without erythema or exudate. ex:    no cords   Lab Results  Component Value Date   WBC 14.6* 09/23/2012   HGB 11.6* 09/23/2012   HCT 37.1 09/23/2012   MCV 82.8 09/23/2012   PLT 315 09/23/2012    A/P  Routine care.  Expect d/c per plan.  Keep patch on eye.  Will check with optho if there are any anesthetic drops that can help and plan for visit when she is d/ced.

## 2012-10-10 ENCOUNTER — Encounter (HOSPITAL_COMMUNITY): Payer: Self-pay | Admitting: *Deleted

## 2012-10-10 MED ORDER — KETOROLAC TROMETHAMINE 0.5 % OP SOLN: 1.0000 [drp] | Freq: Four times a day (QID) | OPHTHALMIC | Status: DC

## 2012-10-10 MED ORDER — OFLOXACIN 0.3 % OP SOLN: 1.0000 [drp] | Freq: Four times a day (QID) | OPHTHALMIC | Status: AC

## 2012-10-10 MED ORDER — OXYCODONE-ACETAMINOPHEN 5-325 MG PO TABS: 1.0000 | ORAL_TABLET | ORAL | Status: DC | PRN

## 2012-10-10 NOTE — Discharge Summary (Signed)
Physician Discharge Summary  Patient ID: Nicole Roth MRN: [id number] DOB/AGE: [birth year] 47 y.o.  Admit date: 10/09/2012 Discharge date: 10/10/2012  Admission Diagnoses:menorrhagia and dysmenorrhea  Discharge Diagnoses: same Active Problems:   * No active hospital problems. *   Discharged Condition: good  Hospital Course: Pt admitted for Robo TLH with salpingectomies and underwent without surgical complications.  She did have R eye pain postoperatively that may be corneal scratch.  Pt started on Ketorolac drops and will be started on Ocuflox at home today.  Otherwise normal post op course.  Consults: phone consult optho  Significant Diagnostic Studies: none  Treatments: surgery: Robotic TLH/salpingectomies and cystoscopy  Discharge Exam: Blood pressure 114/70, pulse 72, temperature 98.1 F (36.7 C), temperature source Oral, resp. rate 16, height 4\' 10"  (1.473 m), weight 83.915 kg (185 lb), SpO2 100.00%.   Disposition: Final discharge disposition not confirmed  Discharge Orders   Future Orders Complete By Expires     Call MD for:  temperature >100.4  As directed     Diet - low sodium heart healthy  As directed     Discharge instructions  As directed     Comments:      No driving on narcotics, no sexual activity for 2 weeks.    Increase activity slowly  As directed     May shower / Bathe  As directed     Comments:      Shower, no bath for 2 weeks.    Remove dressing in 24 hours  As directed     Sexual Activity Restrictions  As directed     Comments:      No sexual activity for 2 weeks.        Medication List    STOP taking these medications       HYDROcodone-acetaminophen 5-325 MG per tablet  Commonly known as:  NORCO/VICODIN      TAKE these medications       ketorolac 0.5 % ophthalmic solution  Commonly known as:  ACULAR  Place 1 drop into the right eye 4 (four) times daily.     montelukast 10 MG tablet  Commonly known as:  SINGULAIR  Take 10 mg by mouth at  bedtime.     ofloxacin 0.3 % ophthalmic solution  Commonly known as:  OCUFLOX  Place 1 drop into the right eye 4 (four) times daily.     oxyCODONE-acetaminophen 5-325 MG per tablet  Commonly known as:  PERCOCET/ROXICET  Take 1-2 tablets by mouth every 4 (four) hours as needed.           Follow-up Information   Follow up with Lyncoln Ledgerwood A, MD. Schedule an appointment as soon as possible for a visit in 2 weeks.   Contact information:   [address] [place] [address] [phone]      Will call Dr. Delaney Meigs for f/u appt. Signed: Laketra Bowdish A 10/10/2012, 8:01 AM

## 2012-10-10 NOTE — Progress Notes (Signed)
Patient is eating, ambulating, and voiding.  Pain control is good.  Dr. Delaney Meigs was consulted by phone yesterday and he suggested Ketorolac drops for pain.  [facility] does not carry ocuflox so will start this at home.  Pt says eye feels better with drops.  BP 114/70  Pulse 72  Temp(Src) 98.1 F (36.7 C) (Oral)  Resp 16  Ht 4\' 10"  (1.473 m)  Wt 83.915 kg (185 lb)  BMI 38.68 kg/m2  SpO2 100%  lungs:   clear to auscultation cor:    RRR Abdomen:  soft, appropriate tenderness, incisions intact and without erythema or exudate. ex:    no cords   Lab Results  Component Value Date   WBC 14.6* 09/23/2012   HGB 11.6* 09/23/2012   HCT 37.1 09/23/2012   MCV 82.8 09/23/2012   PLT 315 09/23/2012    A/P  Routine care.  Expect d/c per plan.  F/u with Dr. Delaney Meigs today.

## 2012-10-10 NOTE — Anesthesia Postprocedure Evaluation (Signed)
  Anesthesia Post-op Note  Patient: Nicole Roth  Procedure(s) Performed: Procedure(s): ROBOTIC ASSISTED TOTAL HYSTERECTOMY (N/A)  Patient Location: [facility]  Anesthesia Type:General  Level of Consciousness: awake, alert  and oriented  Airway and Oxygen Therapy: Patient Spontanous Breathing and Patient connected to nasal cannula oxygen  Post-op Pain: none  Post-op Assessment: Post-op Vital signs reviewed and Patient's Cardiovascular Status Stable  Post-op Vital Signs: Reviewed and stable  Complications: No apparent anesthesia complications

## 2014-01-12 ENCOUNTER — Other Ambulatory Visit: Payer: Self-pay | Admitting: Obstetrics and Gynecology

## 2014-01-12 DIAGNOSIS — R928 Other abnormal and inconclusive findings on diagnostic imaging of breast: Secondary | ICD-10-CM

## 2014-01-22 ENCOUNTER — Ambulatory Visit
Admission: RE | Admit: 2014-01-22 | Discharge: 2014-01-22 | Disposition: A | Discharge status: Home / Self Care | Payer: BC Managed Care – PPO | Source: Ambulatory Visit | Attending: Obstetrics and Gynecology | Admitting: Obstetrics and Gynecology

## 2014-01-22 DIAGNOSIS — R928 Other abnormal and inconclusive findings on diagnostic imaging of breast: Secondary | ICD-10-CM

## 2019-09-02 ENCOUNTER — Telehealth: Payer: Self-pay | Admitting: Family Medicine

## 2019-09-25 ENCOUNTER — Other Ambulatory Visit: Payer: Self-pay

## 2019-09-25 ENCOUNTER — Encounter: Payer: Self-pay | Admitting: Family Medicine

## 2019-09-25 ENCOUNTER — Ambulatory Visit: Payer: 59 | Admitting: Family Medicine

## 2019-09-25 VITALS — BP 162/88 | HR 118 | Temp 98.0°F | Ht <= 58 in | Wt 137.2 lb

## 2019-09-25 DIAGNOSIS — Z0001 Encounter for general adult medical examination with abnormal findings: Secondary | ICD-10-CM

## 2019-09-25 DIAGNOSIS — Z1211 Encounter for screening for malignant neoplasm of colon: Secondary | ICD-10-CM

## 2019-09-25 DIAGNOSIS — Z0000 Encounter for general adult medical examination without abnormal findings: Secondary | ICD-10-CM

## 2019-09-25 DIAGNOSIS — J302 Other seasonal allergic rhinitis: Secondary | ICD-10-CM

## 2019-09-25 DIAGNOSIS — R030 Elevated blood-pressure reading, without diagnosis of hypertension: Secondary | ICD-10-CM

## 2019-09-25 DIAGNOSIS — R12 Heartburn: Secondary | ICD-10-CM | POA: Diagnosis not present

## 2019-09-25 NOTE — Patient Instructions (Addendum)
DASH Eating Plan DASH stands for "Dietary Approaches to Stop Hypertension." The DASH eating plan is a healthy eating plan that has been shown to reduce high blood pressure (hypertension). It may also reduce your risk for type 2 diabetes, heart disease, and stroke. The DASH eating plan may also help with weight loss. What are tips for following this plan?  General guidelines  Avoid eating more than 2,300 mg (milligrams) of salt (sodium) a day. If you have hypertension, you may need to reduce your sodium intake to 1,500 mg a day.  Limit alcohol intake to no more than 1 drink a day for nonpregnant women and 2 drinks a day for men. One drink equals 12 oz of beer, 5 oz of wine, or 1 oz of hard liquor.  Work with your health care provider to maintain a healthy body weight or to lose weight. Ask what an ideal weight is for you.  Get at least 30 minutes of exercise that causes your heart to beat faster (aerobic exercise) most days of the week. Activities may include walking, swimming, or biking.  Work with your health care provider or diet and nutrition specialist (dietitian) to adjust your eating plan to your individual calorie needs. Reading food labels   Check food labels for the amount of sodium per serving. Choose foods with less than 5 percent of the Daily Value of sodium. Generally, foods with less than 300 mg of sodium per serving fit into this eating plan.  To find whole grains, look for the word "whole" as the first word in the ingredient list. Shopping  Buy products labeled as "low-sodium" or "no salt added."  Buy fresh foods. Avoid canned foods and premade or frozen meals. Cooking  Avoid adding salt when cooking. Use salt-free seasonings or herbs instead of table salt or sea salt. Check with your health care provider or pharmacist before using salt substitutes.  Do not fry foods. Cook foods using healthy methods such as baking, boiling, grilling, and broiling instead.  Cook  with heart-healthy oils, such as olive, canola, soybean, or sunflower oil. Meal planning  Eat a balanced diet that includes: ? 5 or more servings of fruits and vegetables each day. At each meal, try to fill half of your plate with fruits and vegetables. ? Up to 6-8 servings of whole grains each day. ? Less than 6 oz of lean meat, poultry, or fish each day. A 3-oz serving of meat is about the same size as a deck of cards. One egg equals 1 oz. ? 2 servings of low-fat dairy each day. ? A serving of nuts, seeds, or beans 5 times each week. ? Heart-healthy fats. Healthy fats called Omega-3 fatty acids are found in foods such as flaxseeds and coldwater fish, like sardines, salmon, and mackerel.  Limit how much you eat of the following: ? Canned or prepackaged foods. ? Food that is high in trans fat, such as fried foods. ? Food that is high in saturated fat, such as fatty meat. ? Sweets, desserts, sugary drinks, and other foods with added sugar. ? Full-fat dairy products.  Do not salt foods before eating.  Try to eat at least 2 vegetarian meals each week.  Eat more home-cooked food and less restaurant, buffet, and fast food.  When eating at a restaurant, ask that your food be prepared with less salt or no salt, if possible. What foods are recommended? The items listed may not be a complete list. Talk with your dietitian  about what dietary choices are best for you. Grains Whole-grain or whole-wheat bread. Whole-grain or whole-wheat pasta. Brown rice. [place]. Bulgur. Whole-grain and low-sodium cereals. Pita bread. Low-fat, low-sodium crackers. Whole-wheat flour tortillas. Vegetables Fresh or frozen vegetables (raw, steamed, roasted, or grilled). Low-sodium or reduced-sodium tomato and vegetable juice. Low-sodium or reduced-sodium tomato sauce and tomato paste. Low-sodium or reduced-sodium canned vegetables. Fruits All fresh, dried, or frozen fruit. Canned fruit in natural juice  (without added sugar). Meat and other protein foods Skinless chicken or [place]. Ground chicken or [place]. Pork with fat trimmed off. Fish and seafood. Egg whites. Dried beans, peas, or lentils. Unsalted nuts, nut butters, and seeds. Unsalted canned beans. Lean cuts of beef with fat trimmed off. Low-sodium, lean deli meat. Dairy Low-fat (1%) or fat-free (skim) milk. Fat-free, low-fat, or reduced-fat cheeses. Nonfat, low-sodium ricotta or cottage cheese. Low-fat or nonfat yogurt. Low-fat, low-sodium cheese. Fats and oils Soft margarine without trans fats. Vegetable oil. Low-fat, reduced-fat, or light mayonnaise and salad dressings (reduced-sodium). Canola, safflower, olive, soybean, and sunflower oils. Avocado. Seasoning and other foods Herbs. Spices. Seasoning mixes without salt. Unsalted popcorn and pretzels. Fat-free sweets. What foods are not recommended? The items listed may not be a complete list. Talk with your dietitian about what dietary choices are best for you. Grains Baked goods made with fat, such as croissants, muffins, or some breads. Dry pasta or rice meal packs. Vegetables Creamed or fried vegetables. Vegetables in a cheese sauce. Regular canned vegetables (not low-sodium or reduced-sodium). Regular canned tomato sauce and paste (not low-sodium or reduced-sodium). Regular tomato and vegetable juice (not low-sodium or reduced-sodium). Angie Fava. Olives. Fruits Canned fruit in a light or heavy syrup. Fried fruit. Fruit in cream or butter sauce. Meat and other protein foods Fatty cuts of meat. Ribs. Fried meat. Berniece Salines. Sausage. Bologna and other processed lunch meats. Salami. Fatback. Hotdogs. Bratwurst. Salted nuts and seeds. Canned beans with added salt. Canned or smoked fish. Whole eggs or egg yolks. Chicken or [place] with skin. Dairy Whole or 2% milk, cream, and half-and-half. Whole or full-fat cream cheese. Whole-fat or sweetened yogurt. Full-fat cheese. Nondairy creamers. Whipped  toppings. Processed cheese and cheese spreads. Fats and oils Butter. Stick margarine. Lard. Shortening. Ghee. Bacon fat. Tropical oils, such as coconut, palm kernel, or palm oil. Seasoning and other foods Salted popcorn and pretzels. Onion salt, garlic salt, seasoned salt, table salt, and sea salt. Worcestershire sauce. Tartar sauce. Barbecue sauce. Teriyaki sauce. Soy sauce, including reduced-sodium. Steak sauce. Canned and packaged gravies. Fish sauce. Oyster sauce. Cocktail sauce. Horseradish that you find on the shelf. Ketchup. Mustard. Meat flavorings and tenderizers. Bouillon cubes. Hot sauce and Tabasco sauce. Premade or packaged marinades. Premade or packaged taco seasonings. Relishes. Regular salad dressings. Where to find more information:  National Heart, Lung, and [place]: https://wilson-eaton.com/  American Heart Association: www.heart.org Summary  The DASH eating plan is a healthy eating plan that has been shown to reduce high blood pressure (hypertension). It may also reduce your risk for type 2 diabetes, heart disease, and stroke.  With the DASH eating plan, you should limit salt (sodium) intake to 2,300 mg a day. If you have hypertension, you may need to reduce your sodium intake to 1,500 mg a day.  When on the DASH eating plan, aim to eat more fresh fruits and vegetables, whole grains, lean proteins, low-fat dairy, and heart-healthy fats.  Work with your health care provider or diet and nutrition specialist (dietitian) to adjust your eating plan to  your individual calorie needs. This information is not intended to replace advice given to you by your health care provider. Make sure you discuss any questions you have with your health care provider. Document Revised: 04/12/2017 Document Reviewed: 04/23/2016 Elsevier Patient Education  2020 Elsevier Inc.   Preventive Care 36-71 Years Old, Female Preventive care refers to visits with your health care provider and lifestyle  choices that can promote health and wellness. This includes:  A yearly physical exam. This may also be called an annual well check.  Regular dental visits and eye exams.  Immunizations.  Screening for certain conditions.  Healthy lifestyle choices, such as eating a healthy diet, getting regular exercise, not using drugs or products that contain nicotine and tobacco, and limiting alcohol use. What can I expect for my preventive care visit? Physical exam Your health care provider will check your:  Height and weight. This may be used to calculate body mass index (BMI), which tells if you are at a healthy weight.  Heart rate and blood pressure.  Skin for abnormal spots. Counseling Your health care provider may ask you questions about your:  Alcohol, tobacco, and drug use.  Emotional well-being.  Home and relationship well-being.  Sexual activity.  Eating habits.  Work and work Statistician.  Method of birth control.  Menstrual cycle.  Pregnancy history. What immunizations do I need?  Influenza (flu) vaccine  This is recommended every year. Tetanus, diphtheria, and pertussis (Tdap) vaccine  You may need a Td booster every 10 years. Varicella (chickenpox) vaccine  You may need this if you have not been vaccinated. Zoster (shingles) vaccine  You may need this after age 73. Measles, mumps, and rubella (MMR) vaccine  You may need at least one dose of MMR if you were born in 1957 or later. You may also need a second dose. Pneumococcal conjugate (PCV13) vaccine  You may need this if you have certain conditions and were not previously vaccinated. Pneumococcal polysaccharide (PPSV23) vaccine  You may need one or two doses if you smoke cigarettes or if you have certain conditions. Meningococcal conjugate (MenACWY) vaccine  You may need this if you have certain conditions. Hepatitis A vaccine  You may need this if you have certain conditions or if you travel or  work in places where you may be exposed to hepatitis A. Hepatitis B vaccine  You may need this if you have certain conditions or if you travel or work in places where you may be exposed to hepatitis B. Haemophilus influenzae type b (Hib) vaccine  You may need this if you have certain conditions. Human papillomavirus (HPV) vaccine  If recommended by your health care provider, you may need three doses over 6 months. You may receive vaccines as individual doses or as more than one vaccine together in one shot (combination vaccines). Talk with your health care provider about the risks and benefits of combination vaccines. What tests do I need? Blood tests  Lipid and cholesterol levels. These may be checked every 5 years, or more frequently if you are over 41 years old.  Hepatitis C test.  Hepatitis B test. Screening  Lung cancer screening. You may have this screening every year starting at age 92 if you have a 30-pack-year history of smoking and currently smoke or have quit within the past 15 years.  Colorectal cancer screening. All adults should have this screening starting at age 23 and continuing until age 74. Your health care provider may recommend screening at age  45 if you are at increased risk. You will have tests every 1-10 years, depending on your results and the type of screening test.  Diabetes screening. This is done by checking your blood sugar (glucose) after you have not eaten for a while (fasting). You may have this done every 1-3 years.  Mammogram. This may be done every 1-2 years. Talk with your health care provider about when you should start having regular mammograms. This may depend on whether you have a family history of breast cancer.  BRCA-related cancer screening. This may be done if you have a family history of breast, ovarian, tubal, or peritoneal cancers.  Pelvic exam and Pap test. This may be done every 3 years starting at age 3. Starting at age 44, this may  be done every 5 years if you have a Pap test in combination with an HPV test. Other tests  Sexually transmitted disease (STD) testing.  Bone density scan. This is done to screen for osteoporosis. You may have this scan if you are at high risk for osteoporosis. Follow these instructions at home: Eating and drinking  Eat a diet that includes fresh fruits and vegetables, whole grains, lean protein, and low-fat dairy.  Take vitamin and mineral supplements as recommended by your health care provider.  Do not drink alcohol if: ? Your health care provider tells you not to drink. ? You are pregnant, may be pregnant, or are planning to become pregnant.  If you drink alcohol: ? Limit how much you have to 0-1 drink a day. ? Be aware of how much alcohol is in your drink. In the [place] one drink equals one 12 oz bottle of beer (355 mL), one 5 oz glass of wine (148 mL), or one 1 oz glass of hard liquor (44 mL). Lifestyle  Take daily care of your teeth and gums.  Stay active. Exercise for at least 30 minutes on 5 or more days each week.  Do not use any products that contain nicotine or tobacco, such as cigarettes, e-cigarettes, and chewing tobacco. If you need help quitting, ask your health care provider.  If you are sexually active, practice safe sex. Use a condom or other form of birth control (contraception) in order to prevent pregnancy and STIs (sexually transmitted infections).  If told by your health care provider, take low-dose aspirin daily starting at age 24. What's next?  Visit your health care provider once a year for a well check visit.  Ask your health care provider how often you should have your eyes and teeth checked.  Stay up to date on all vaccines. This information is not intended to replace advice given to you by your health care provider. Make sure you discuss any questions you have with your health care provider. Document Revised: 01/09/2018 Document Reviewed: 01/09/2018  Elsevier Patient Education  2020 [employer].

## 2019-09-25 NOTE — Progress Notes (Signed)
New Patient Office Visit  Assessment & Plan:  1. Well adult exam - Preventive health education provided. Patient declined HIV screening and Shingrix. Cologuard ordered. She will schedule mammo on the bus.  - CBC with Differential/Platelet - CMP14+EGFR - Lipid panel  2. Seasonal allergies - Well controlled on current regimen.   3. Heartburn - Well controlled on current regimen.   4. Elevated BP without diagnosis of hypertension - Education provided on the DASH diet. Encouraged dietary changes and exercise. Patient to monitor BP at home and keep a log to bring with her to her next appointment.   5. Colon cancer screening - Cologuard   Follow-up: Return in about 3 months (around 12/26/2019) for BP.   Hendricks Limes, MSN, APRN, FNP-C [facility]  Subjective:  Patient ID: Nicole Roth, female    DOB: [birth year]  Age: 54 y.o. MRN: [id number]  Patient Care Team: Loman Brooklyn, FNP as PCP - General (Family Medicine)  CC:  Chief Complaint  Patient presents with  . New Patient (Initial Visit)  . Establish Care    HPI Nicole Roth presents to establish care. She states she has not seen a PCP in years.   Patient has no complaints or concerns today.    Review of Systems  Constitutional: Negative for chills, fever, malaise/fatigue and weight loss.  HENT: Positive for tinnitus. Negative for congestion, ear discharge, ear pain, nosebleeds, sinus pain and sore throat.   Eyes: Negative for blurred vision, double vision, pain, discharge and redness.  Respiratory: Negative for cough, shortness of breath and wheezing.   Cardiovascular: Negative for chest pain, palpitations and leg swelling.  Gastrointestinal: Negative for abdominal pain, constipation, diarrhea, heartburn, nausea and vomiting.  Genitourinary: Negative for dysuria, frequency and urgency.  Musculoskeletal: Negative for myalgias.  Skin: Negative for rash.  Neurological: Negative for dizziness,  seizures, weakness and headaches.  Psychiatric/Behavioral: Negative for depression, substance abuse and suicidal ideas. The patient is not nervous/anxious.     Current Outpatient Medications:  .  Famotidine (PEPCID PO), Take by mouth daily., Disp: , Rfl:  .  loratadine (CLARITIN) 10 MG tablet, Take 10 mg by mouth in the morning and at bedtime., Disp: , Rfl:   Allergies  Allergen Reactions  . Bee Pollen   . Bee Venom Hives  . Lisinopril Swelling    Lip swelling.  . Sulfa Antibiotics Hives    Past Medical History:  Diagnosis Date  . History of kidney stones    passes stone - no surgery required  . Seasonal allergies     Past Surgical History:  Procedure Laterality Date  . CESAREAN SECTION    . ROBOTIC ASSISTED TOTAL HYSTERECTOMY N/A 10/09/2012   Procedure: ROBOTIC ASSISTED TOTAL HYSTERECTOMY;  Surgeon: Daria Pastures, MD;  Location: [facility];  Service: Gynecology;  Laterality: N/A;  . TUBAL LIGATION    . WISDOM TOOTH EXTRACTION      Family History  Problem Relation Age of Onset  . Bipolar disorder Mother   . Diabetes Father   . Hypertension Father   . Heart disease Sister   . Hyperlipidemia Sister   . Thyroid disease Sister   . Heart disease Paternal Grandmother     Social History   Socioeconomic History  . Marital status: Single    Spouse name: Not on file  . Number of children: Not on file  . Years of education: Not on file  . Highest education level: Not on file  Occupational History  .  Not on file  Tobacco Use  . Smoking status: Former Smoker    Packs/day: 1.00    Years: 30.00    Pack years: 30.00    Types: Cigarettes    Quit date: 05/15/2015    Years since quitting: 4.3  . Smokeless tobacco: Never Used  Substance and Sexual Activity  . Alcohol use: No  . Drug use: No  . Sexual activity: Not Currently    Birth control/protection: Surgical  Other Topics Concern  . Not on file  Social History Narrative  . Not on file   Social Determinants of  Health   Financial Resource Strain:   . Difficulty of Paying Living Expenses:   Food Insecurity:   . Worried About Charity fundraiser in the Last Year:   . Arboriculturist in the Last Year:   Transportation Needs:   . Film/video editor (Medical):   Marland Kitchen Lack of Transportation (Non-Medical):   Physical Activity:   . Days of Exercise per Week:   . Minutes of Exercise per Session:   Stress:   . Feeling of Stress :   Social Connections:   . Frequency of Communication with Friends and Family:   . Frequency of Social Gatherings with Friends and Family:   . Attends Religious Services:   . Active Member of Clubs or Organizations:   . Attends Archivist Meetings:   Marland Kitchen Marital Status:   Intimate Partner Violence:   . Fear of Current or Ex-Partner:   . Emotionally Abused:   Marland Kitchen Physically Abused:   . Sexually Abused:     Objective:   Today's Vitals: BP (!) 162/88 Comment: manual  Pulse (!) 118   Temp 98 F (36.7 C) (Temporal)   Ht 4' 10"  (1.473 m)   Wt 137 lb 3.2 oz (62.2 kg)   SpO2 99%   BMI 28.67 kg/m   Physical Exam Vitals reviewed.  Constitutional:      General: She is not in acute distress.    Appearance: Normal appearance. She is overweight. She is not ill-appearing, toxic-appearing or diaphoretic.  HENT:     Head: Normocephalic and atraumatic.     Right Ear: Tympanic membrane, ear canal and external ear normal. There is no impacted cerumen.     Left Ear: Tympanic membrane, ear canal and external ear normal. There is no impacted cerumen.     Nose: Nose normal. No congestion or rhinorrhea.     Mouth/Throat:     Mouth: Mucous membranes are moist.     Pharynx: Oropharynx is clear. No oropharyngeal exudate or posterior oropharyngeal erythema.  Eyes:     General: No scleral icterus.       Right eye: No discharge.        Left eye: No discharge.     Conjunctiva/sclera: Conjunctivae normal.     Pupils: Pupils are equal, round, and reactive to light.      Comments: Xanthelasmas bilaterally  Cardiovascular:     Rate and Rhythm: Normal rate and regular rhythm.     Heart sounds: Normal heart sounds. No murmur. No friction rub. No gallop.   Pulmonary:     Effort: Pulmonary effort is normal. No respiratory distress.     Breath sounds: Normal breath sounds. No stridor. No wheezing, rhonchi or rales.  Abdominal:     General: Abdomen is flat. Bowel sounds are normal. There is no distension.     Palpations: Abdomen is soft. There is no mass.  Tenderness: There is no abdominal tenderness. There is no guarding or rebound.     Hernia: No hernia is present.  Musculoskeletal:        General: Normal range of motion.     Cervical back: Normal range of motion and neck supple. No rigidity. No muscular tenderness.  Lymphadenopathy:     Cervical: No cervical adenopathy.  Skin:    General: Skin is warm and dry.     Capillary Refill: Capillary refill takes less than 2 seconds.  Neurological:     General: No focal deficit present.     Mental Status: She is alert and oriented to person, place, and time. Mental status is at baseline.  Psychiatric:        Mood and Affect: Mood normal.        Behavior: Behavior normal.        Thought Content: Thought content normal.        Judgment: Judgment normal.

## 2019-09-26 LAB — LIPID PANEL
Chol/HDL Ratio: 3.6 ratio (ref 0.0–4.4)
Cholesterol, Total: 216 mg/dL — ABNORMAL HIGH (ref 100–199)
HDL: 60 mg/dL (ref >39)
LDL Chol Calc (NIH): 145 mg/dL — ABNORMAL HIGH (ref 0–99)
Triglycerides: 63 mg/dL (ref 0–149)
VLDL Cholesterol Cal: 11 mg/dL (ref 5–40)

## 2019-09-26 LAB — CBC WITH DIFFERENTIAL/PLATELET
Basophils Absolute: 0.1 10*3/uL (ref 0.0–0.2)
Basos: 1 %
EOS (ABSOLUTE): 0.2 10*3/uL (ref 0.0–0.4)
Eos: 2 %
Hematocrit: 49.2 % — ABNORMAL HIGH (ref 34.0–46.6)
Hemoglobin: 16.6 g/dL — ABNORMAL HIGH (ref 11.1–15.9)
Immature Grans (Abs): 0.1 10*3/uL (ref 0.0–0.1)
Immature Granulocytes: 1 %
Lymphocytes Absolute: 3.2 10*3/uL — ABNORMAL HIGH (ref 0.7–3.1)
Lymphs: 31 %
MCH: 31.3 pg (ref 26.6–33.0)
MCHC: 33.7 g/dL (ref 31.5–35.7)
MCV: 93 fL (ref 79–97)
Monocytes Absolute: 0.6 10*3/uL (ref 0.1–0.9)
Monocytes: 5 %
Neutrophils Absolute: 6.2 10*3/uL (ref 1.4–7.0)
Neutrophils: 60 %
Platelets: 204 10*3/uL (ref 150–450)
RBC: 5.31 x10E6/uL — ABNORMAL HIGH (ref 3.77–5.28)
RDW: 12.5 % (ref 11.7–15.4)
WBC: 10.3 10*3/uL (ref 3.4–10.8)

## 2019-09-26 LAB — CMP14+EGFR
ALT: 14 IU/L (ref 0–32)
AST: 15 IU/L (ref 0–40)
Albumin/Globulin Ratio: 1.5 (ref 1.2–2.2)
Albumin: 4.7 g/dL (ref 3.8–4.9)
Alkaline Phosphatase: 114 IU/L (ref 39–117)
BUN/Creatinine Ratio: 23 (ref 9–23)
BUN: 17 mg/dL (ref 6–24)
Bilirubin Total: 0.2 mg/dL (ref 0.0–1.2)
CO2: 21 mmol/L (ref 20–29)
Calcium: 10.1 mg/dL (ref 8.7–10.2)
Chloride: 100 mmol/L (ref 96–106)
Creatinine, Ser: 0.75 mg/dL (ref 0.57–1.00)
GFR calc Af Amer: 105 mL/min/{1.73_m2} (ref >59)
GFR calc non Af Amer: 91 mL/min/{1.73_m2} (ref >59)
Globulin, Total: 3.1 g/dL (ref 1.5–4.5)
Glucose: 293 mg/dL — ABNORMAL HIGH (ref 65–99)
Potassium: 4.3 mmol/L (ref 3.5–5.2)
Sodium: 139 mmol/L (ref 134–144)
Total Protein: 7.8 g/dL (ref 6.0–8.5)

## 2019-09-28 ENCOUNTER — Encounter: Payer: Self-pay | Admitting: Family Medicine

## 2019-09-28 DIAGNOSIS — E782 Mixed hyperlipidemia: Secondary | ICD-10-CM | POA: Insufficient documentation

## 2019-09-29 ENCOUNTER — Telehealth: Payer: Self-pay | Admitting: Family Medicine

## 2019-09-29 NOTE — Telephone Encounter (Signed)
Patient aware of la results

## 2019-09-30 LAB — SPECIMEN STATUS REPORT

## 2019-09-30 LAB — HGB A1C W/O EAG: Hgb A1c MFr Bld: 13 % — ABNORMAL HIGH (ref 4.8–5.6)

## 2019-10-01 ENCOUNTER — Ambulatory Visit: Payer: 59 | Admitting: Pharmacist

## 2019-10-01 ENCOUNTER — Other Ambulatory Visit: Payer: Self-pay

## 2019-10-01 DIAGNOSIS — E119 Type 2 diabetes mellitus without complications: Secondary | ICD-10-CM

## 2019-10-01 DIAGNOSIS — E782 Mixed hyperlipidemia: Secondary | ICD-10-CM | POA: Diagnosis not present

## 2019-10-01 MED ORDER — METFORMIN HCL ER 500 MG PO TB24: 500.0000 mg | ORAL_TABLET | Freq: Two times a day (BID) | ORAL | 3 refills | Status: DC

## 2019-10-01 MED ORDER — SIMVASTATIN 10 MG PO TABS
10.0000 mg | ORAL_TABLET | Freq: Every day | ORAL | 3 refills | Status: DC
Start: 2019-10-01 — End: 2020-01-12

## 2019-10-04 ENCOUNTER — Encounter: Payer: Self-pay | Admitting: Pharmacist

## 2019-10-04 DIAGNOSIS — E119 Type 2 diabetes mellitus without complications: Secondary | ICD-10-CM | POA: Insufficient documentation

## 2019-10-04 NOTE — Progress Notes (Signed)
    10/01/2019 Name: Nicole Roth MRN: [id number] DOB: Jun 24, 1965   S:  40 yoF presents for diabetes evaluation, education, and management Patient was referred and last seen by Primary Care Provider on April 2021. Patient reports Diabetes was diagnosed this week (may 14th 2021).  Insurance coverage/medication affordability: [employer] (medical) (high deductible plan) . Current diabetes medications include: n/a, new DX . Current hypertension medications include: n/a Goal 130/80 . Current hyperlipidemia medications include: n/a LDL 145 on 09/25/19 STATIN ALLERGY/INTOLERANCE DOCUMENTED IN EMR n/a  Patient reported dietary habits: Eats 2-3 meals/day  Discussed meal planning options and Plate method for health eating Avoid sugary drinks and desserts Incorporate balanced protein, non starchy veggies, 1 serving of carbohydrate Increase water intake . Increase physical activity as able.  Patient-reported exercise habits: works at a daycare (active)    O:  Lab Results  Component Value Date   HGBA1C 13.0 (H) 09/25/2019    Lipid Panel     Component Value Date/Time   CHOL 216 (H) 09/25/2019 1339   TRIG 63 09/25/2019 1339   HDL 60 09/25/2019 1339   CHOLHDL 3.6 09/25/2019 1339   LDLCALC 145 (H) 09/25/2019 1339     Home fasting blood sugars: n/a  2 hour post-meal/random blood sugars: n/a.    A/P:  Diabetes new onset T2DM currently uncontrolled. Patient is very frustrated by this diagnosis because she has worked so hard to lose weight and has made great progress in that endeavor.  -Discussed insulin and injectables with patient, however she has refused x3.  -OF note, patient has [employer] ($8000 high deductible plan).  Medication options will be very limited due to this circumstance  -STARTED metfomin XR 500mg  BID with meals to ease patient in  -STARTED simvastatin 10mg  (based on cost at [facility]--simvastatin was the most affordable option).  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  Will titrate medication to goal.  -Will introduce SGLT2 (Steglatro) at next appt --> [employer] has a copay card that will work $0 for 12 months.  -Extensively discussed pathophysiology of diabetes, recommended lifestyle interventions, dietary effects on blood sugar control  -Counseled on s/sx of and management of hypoglycemia  -Next A1C anticipated July 2021.   Written patient instructions provided.  Total time in face to face counseling 30 minutes.   Follow up Pharmacist Clinic Visit in 3 weeks.    , PharmD, BCPS Clinical Pharmacist, [facility]  II Phone [phone]

## 2019-10-13 ENCOUNTER — Encounter: Payer: Self-pay | Admitting: Family Medicine

## 2019-11-12 ENCOUNTER — Other Ambulatory Visit: Payer: Self-pay | Admitting: Family Medicine

## 2019-11-12 DIAGNOSIS — Z1231 Encounter for screening mammogram for malignant neoplasm of breast: Secondary | ICD-10-CM

## 2019-11-12 LAB — COLOGUARD: Cologuard: NEGATIVE

## 2019-11-13 ENCOUNTER — Encounter: Payer: Self-pay | Admitting: Family Medicine

## 2019-12-09 ENCOUNTER — Ambulatory Visit
Admission: RE | Admit: 2019-12-09 | Discharge: 2019-12-09 | Disposition: A | Discharge status: Home / Self Care | Payer: 59 | Source: Ambulatory Visit | Attending: Family Medicine | Admitting: Family Medicine

## 2019-12-09 ENCOUNTER — Other Ambulatory Visit: Payer: Self-pay

## 2019-12-09 DIAGNOSIS — Z1231 Encounter for screening mammogram for malignant neoplasm of breast: Secondary | ICD-10-CM

## 2019-12-25 ENCOUNTER — Ambulatory Visit: Payer: 59 | Admitting: Family Medicine

## 2020-01-12 ENCOUNTER — Encounter: Payer: Self-pay | Admitting: Family Medicine

## 2020-01-12 ENCOUNTER — Ambulatory Visit: Payer: 59 | Admitting: Family Medicine

## 2020-01-12 ENCOUNTER — Other Ambulatory Visit: Payer: Self-pay

## 2020-01-12 VITALS — BP 181/99 | HR 108 | Temp 97.6°F | Ht <= 58 in | Wt 130.8 lb

## 2020-01-12 DIAGNOSIS — I10 Essential (primary) hypertension: Secondary | ICD-10-CM | POA: Diagnosis not present

## 2020-01-12 DIAGNOSIS — E782 Mixed hyperlipidemia: Secondary | ICD-10-CM | POA: Diagnosis not present

## 2020-01-12 DIAGNOSIS — E119 Type 2 diabetes mellitus without complications: Secondary | ICD-10-CM | POA: Diagnosis not present

## 2020-01-12 LAB — CMP14+EGFR
ALT: 18 IU/L (ref 0–32)
AST: 16 IU/L (ref 0–40)
Albumin/Globulin Ratio: 1.8 (ref 1.2–2.2)
Albumin: 4.4 g/dL (ref 3.8–4.9)
Alkaline Phosphatase: 81 IU/L (ref 48–121)
BUN/Creatinine Ratio: 24 — ABNORMAL HIGH (ref 9–23)
BUN: 21 mg/dL (ref 6–24)
Bilirubin Total: 0.2 mg/dL (ref 0.0–1.2)
CO2: 23 mmol/L (ref 20–29)
Calcium: 9.4 mg/dL (ref 8.7–10.2)
Chloride: 104 mmol/L (ref 96–106)
Creatinine, Ser: 0.87 mg/dL (ref 0.57–1.00)
GFR calc Af Amer: 88 mL/min/{1.73_m2} (ref >59)
GFR calc non Af Amer: 76 mL/min/{1.73_m2} (ref >59)
Globulin, Total: 2.5 g/dL (ref 1.5–4.5)
Glucose: 206 mg/dL — ABNORMAL HIGH (ref 65–99)
Potassium: 4.6 mmol/L (ref 3.5–5.2)
Sodium: 143 mmol/L (ref 134–144)
Total Protein: 6.9 g/dL (ref 6.0–8.5)

## 2020-01-12 LAB — BAYER DCA HB A1C WAIVED: HB A1C (BAYER DCA - WAIVED): 9.1 % — ABNORMAL HIGH (ref <7.0)

## 2020-01-12 MED ORDER — PRAVASTATIN SODIUM 40 MG PO TABS: 40.0000 mg | ORAL_TABLET | Freq: Every day | ORAL | 2 refills | Status: DC

## 2020-01-12 MED ORDER — METFORMIN HCL ER 500 MG PO TB24: 1000.0000 mg | ORAL_TABLET | Freq: Two times a day (BID) | ORAL | 1 refills | Status: DC

## 2020-01-12 MED ORDER — AMLODIPINE BESYLATE 5 MG PO TABS: 5.0000 mg | ORAL_TABLET | Freq: Every day | ORAL | 2 refills | Status: DC

## 2020-01-12 NOTE — Progress Notes (Signed)
Assessment & Plan:  1. Type 2 diabetes mellitus without complication, without long-term current use of insulin (HCC) Lab Results  Component Value Date   HGBA1C 9.1 (H) 01/12/2020   HGBA1C 13.0 (H) 09/25/2019  - Diabetes is not at goal of A1c < 7, but is improving. - Medications: increase metformin from 500 mg BID to 1,000 mg BID. Patient understands to increase by 500 mg per week. Patient declined starting a different medication, such as Steglatro recommended by clinical pharmacist at her last visit.  - Home glucose monitoring: continue monitoring - Patient is currently taking a statin. Patient is taking an ACE-inhibitor/ARB.  - Last foot exam: 01/12/2020 - Last diabetic eye exam: never - Urine Microalbumin/Creat Ratio: 01/12/2020 - Instruction/counseling given: reminded to get eye exam, discussed foot care and discussed diet - Microalbumin / creatinine urine ratio - Bayer DCA Hb A1c Waived - metFORMIN (GLUCOPHAGE XR) 500 MG 24 hr tablet; Take 2 tablets (1,000 mg total) by mouth 2 (two) times daily with a meal.  Dispense: 360 tablet; Refill: 1 - CMP14+EGFR - pravastatin (PRAVACHOL) 40 MG tablet; Take 1 tablet (40 mg total) by mouth daily.  Dispense: 30 tablet; Refill: 2  2. Mixed hyperlipidemia - Patient did not tolerate simvastatin. Rx'ing pravastatin today.  - pravastatin (PRAVACHOL) 40 MG tablet; Take 1 tablet (40 mg total) by mouth daily.  Dispense: 30 tablet; Refill: 2  3. Essential hypertension - Uncontrolled. Started patient on amlodipine 5 mg QD. Education provided on DASH diet. Encouraged diet and exercise. Allergy to ACE-inhibitors. ARBs avoided due to ACE allergy of lip swelling.  - amLODipine (NORVASC) 5 MG tablet; Take 1 tablet (5 mg total) by mouth daily.  Dispense: 30 tablet; Refill: 2   Return in about 4 weeks (around 02/09/2020) for HTN.  Hendricks Limes, MSN, APRN, FNP-C [facility]  Subjective:    Patient ID: Nicole Roth, female    DOB:  January 21, 1966, 54 y.o.   MRN: [id number]  Patient Care Team: Loman Brooklyn, FNP as PCP - General (Family Medicine)   Chief Complaint:  Chief Complaint  Patient presents with   Diabetes    3 month follow up    Hypertension    HPI: Nicole Roth is a 54 y.o. female presenting on 01/12/2020 for Diabetes (3 month follow up ) and Hypertension  Diabetes: Patient presents for follow up of diabetes. Current symptoms include: none. Known diabetic complications: none. Medication compliance: yes. Current diet: in general, a "healthy" diet  . Current exercise: none. Home blood sugar records: fasting 120s; other times 140s. Is she  on ACE inhibitor or angiotensin II receptor blocker? No. Is she on a statin? No - intolerant of simvastatin.   Lab Results  Component Value Date   HGBA1C 9.1 (H) 01/12/2020   HGBA1C 13.0 (H) 09/25/2019   Lab Results  Component Value Date   LDLCALC 145 (H) 09/25/2019   CREATININE 0.75 09/25/2019     Hypertension: Patient here for follow-up of elevated blood pressure. She is not exercising and is adherent to low salt diet.  Blood pressure is not well controlled at home. Cardiac symptoms none. Cardiovascular risk factors: diabetes mellitus, dyslipidemia, hypertension and sedentary lifestyle. Use of agents associated with hypertension: none. History of target organ damage: none.  New complaints: None  Social history:  Relevant past medical, surgical, family and social history reviewed and updated as indicated. Interim medical history since our last visit reviewed.  Allergies and medications reviewed and updated.  DATA REVIEWED:  CHART IN EPIC  ROS: Negative unless specifically indicated above in HPI.    Current Outpatient Medications:    Famotidine (PEPCID PO), Take by mouth daily., Disp: , Rfl:    loratadine (CLARITIN) 10 MG tablet, Take 10 mg by mouth in the morning and at bedtime., Disp: , Rfl:    metFORMIN (GLUCOPHAGE XR) 500 MG 24 hr tablet, Take 1 tablet  (500 mg total) by mouth in the morning and at bedtime., Disp: 60 tablet, Rfl: 3   Allergies  Allergen Reactions   Bee Pollen    Bee Venom Hives   Lisinopril Swelling    Lip swelling.   Sulfa Antibiotics Hives   Past Medical History:  Diagnosis Date   History of kidney stones    passes stone - no surgery required   Mixed hyperlipidemia    Seasonal allergies     Past Surgical History:  Procedure Laterality Date   CESAREAN SECTION     ROBOTIC ASSISTED TOTAL HYSTERECTOMY N/A 10/09/2012   Procedure: ROBOTIC ASSISTED TOTAL HYSTERECTOMY;  Surgeon: Daria Pastures, MD;  Location: [facility];  Service: Gynecology;  Laterality: N/A;   TUBAL LIGATION     WISDOM TOOTH EXTRACTION      Social History   Socioeconomic History   Marital status: Single    Spouse name: Not on file   Number of children: Not on file   Years of education: Not on file   Highest education level: Not on file  Occupational History   Not on file  Tobacco Use   Smoking status: Former Smoker    Packs/day: 1.00    Years: 30.00    Pack years: 30.00    Types: Cigarettes    Quit date: 05/15/2015    Years since quitting: 4.6   Smokeless tobacco: Never Used  Vaping Use   Vaping Use: Never used  Substance and Sexual Activity   Alcohol use: No   Drug use: No   Sexual activity: Not Currently    Birth control/protection: Surgical  Other Topics Concern   Not on file  Social History Narrative   Not on file   Social Determinants of Health   Financial Resource Strain:    Difficulty of Paying Living Expenses: Not on file  Food Insecurity:    Worried About Charity fundraiser in the Last Year: Not on file   [employer] of Food in the Last Year: Not on file  Transportation Needs:    Lack of Transportation (Medical): Not on file   Lack of Transportation (Non-Medical): Not on file  Physical Activity:    Days of Exercise per Week: Not on file   Minutes of Exercise per Session: Not on file   Stress:    Feeling of Stress : Not on file  Social Connections:    Frequency of Communication with Friends and Family: Not on file   Frequency of Social Gatherings with Friends and Family: Not on file   Attends Religious Services: Not on file   Active Member of Clubs or Organizations: Not on file   Attends Archivist Meetings: Not on file   Marital Status: Not on file  Intimate Partner Violence:    Fear of Current or Ex-Partner: Not on file   Emotionally Abused: Not on file   Physically Abused: Not on file   Sexually Abused: Not on file        Objective:    BP (!) 181/99    Pulse (!) 108  Temp 97.6 F (36.4 C) (Temporal)    Ht _0  (1.473 m)    Wt 130 lb 12.8 oz (59.3 kg)    SpO2 100%    BMI 27.34 kg/m   Wt Readings from Last 3 Encounters:  01/12/20 130 lb 12.8 oz (59.3 kg)  09/25/19 137 lb 3.2 oz (62.2 kg)  10/09/12 185 lb (83.9 kg)    Physical Exam Vitals reviewed.  Constitutional:      General: She is not in acute distress.    Appearance: Normal appearance. She is overweight. She is not ill-appearing, toxic-appearing or diaphoretic.  HENT:     Head: Normocephalic and atraumatic.  Eyes:     General: No scleral icterus.       Right eye: No discharge.        Left eye: No discharge.     Conjunctiva/sclera: Conjunctivae normal.  Cardiovascular:     Rate and Rhythm: Normal rate and regular rhythm.     Heart sounds: Normal heart sounds. No murmur heard.  No friction rub. No gallop.   Pulmonary:     Effort: Pulmonary effort is normal. No respiratory distress.     Breath sounds: Normal breath sounds. No stridor. No wheezing, rhonchi or rales.  Musculoskeletal:        General: Normal range of motion.     Cervical back: Normal range of motion.  Skin:    General: Skin is warm and dry.     Capillary Refill: Capillary refill takes less than 2 seconds.  Neurological:     General: No focal deficit present.     Mental Status: She is alert and  oriented to person, place, and time. Mental status is at baseline.  Psychiatric:        Mood and Affect: Mood normal.        Behavior: Behavior normal.        Thought Content: Thought content normal.        Judgment: Judgment normal.    Diabetic Foot Exam - Simple   Simple Foot Form Diabetic Foot exam was performed with the following findings: Yes 01/12/2020  9:15 AM  Visual Inspection No deformities, no ulcerations, no other skin breakdown bilaterally: Yes Sensation Testing Intact to touch and monofilament testing bilaterally: Yes Pulse Check Posterior Tibialis and Dorsalis pulse intact bilaterally: Yes Comments     No results found for: TSH Lab Results  Component Value Date   WBC 10.3 09/25/2019   HGB 16.6 (H) 09/25/2019   HCT 49.2 (H) 09/25/2019   MCV 93 09/25/2019   PLT 204 09/25/2019   Lab Results  Component Value Date   NA 139 09/25/2019   K 4.3 09/25/2019   CO2 21 09/25/2019   GLUCOSE 293 (H) 09/25/2019   BUN 17 09/25/2019   CREATININE 0.75 09/25/2019   BILITOT 0.2 09/25/2019   ALKPHOS 114 09/25/2019   AST 15 09/25/2019   ALT 14 09/25/2019   PROT 7.8 09/25/2019   ALBUMIN 4.7 09/25/2019   CALCIUM 10.1 09/25/2019   Lab Results  Component Value Date   CHOL 216 (H) 09/25/2019   Lab Results  Component Value Date   HDL 60 09/25/2019   Lab Results  Component Value Date   LDLCALC 145 (H) 09/25/2019   Lab Results  Component Value Date   TRIG 63 09/25/2019   Lab Results  Component Value Date   CHOLHDL 3.6 09/25/2019   Lab Results  Component Value Date   HGBA1C 13.0 (H) 09/25/2019

## 2020-01-12 NOTE — Patient Instructions (Signed)
DASH Eating Plan DASH stands for "Dietary Approaches to Stop Hypertension." The DASH eating plan is a healthy eating plan that has been shown to reduce high blood pressure (hypertension). It may also reduce your risk for type 2 diabetes, heart disease, and stroke. The DASH eating plan may also help with weight loss. What are tips for following this plan?  General guidelines  Avoid eating more than 2,300 mg (milligrams) of salt (sodium) a day. If you have hypertension, you may need to reduce your sodium intake to 1,500 mg a day.  Limit alcohol intake to no more than 1 drink a day for nonpregnant women and 2 drinks a day for men. One drink equals 12 oz of beer, 5 oz of wine, or 1 oz of hard liquor.  Work with your health care provider to maintain a healthy body weight or to lose weight. Ask what an ideal weight is for you.  Get at least 30 minutes of exercise that causes your heart to beat faster (aerobic exercise) most days of the week. Activities may include walking, swimming, or biking.  Work with your health care provider or diet and nutrition specialist (dietitian) to adjust your eating plan to your individual calorie needs. Reading food labels   Check food labels for the amount of sodium per serving. Choose foods with less than 5 percent of the Daily Value of sodium. Generally, foods with less than 300 mg of sodium per serving fit into this eating plan.  To find whole grains, look for the word "whole" as the first word in the ingredient list. Shopping  Buy products labeled as "low-sodium" or "no salt added."  Buy fresh foods. Avoid canned foods and premade or frozen meals. Cooking  Avoid adding salt when cooking. Use salt-free seasonings or herbs instead of table salt or sea salt. Check with your health care provider or pharmacist before using salt substitutes.  Do not fry foods. Cook foods using healthy methods such as baking, boiling, grilling, and broiling instead.  Cook with  heart-healthy oils, such as olive, canola, soybean, or sunflower oil. Meal planning  Eat a balanced diet that includes: ? 5 or more servings of fruits and vegetables each day. At each meal, try to fill half of your plate with fruits and vegetables. ? Up to 6-8 servings of whole grains each day. ? Less than 6 oz of lean meat, poultry, or fish each day. A 3-oz serving of meat is about the same size as a deck of cards. One egg equals 1 oz. ? 2 servings of low-fat dairy each day. ? A serving of nuts, seeds, or beans 5 times each week. ? Heart-healthy fats. Healthy fats called Omega-3 fatty acids are found in foods such as flaxseeds and coldwater fish, like sardines, salmon, and mackerel.  Limit how much you eat of the following: ? Canned or prepackaged foods. ? Food that is high in trans fat, such as fried foods. ? Food that is high in saturated fat, such as fatty meat. ? Sweets, desserts, sugary drinks, and other foods with added sugar. ? Full-fat dairy products.  Do not salt foods before eating.  Try to eat at least 2 vegetarian meals each week.  Eat more home-cooked food and less restaurant, buffet, and fast food.  When eating at a restaurant, ask that your food be prepared with less salt or no salt, if possible. What foods are recommended? The items listed may not be a complete list. Talk with your dietitian about   what dietary choices are best for you. Grains Whole-grain or whole-wheat bread. Whole-grain or whole-wheat pasta. Brown rice. Oatmeal. Quinoa. Bulgur. Whole-grain and low-sodium cereals. Pita bread. Low-fat, low-sodium crackers. Whole-wheat flour tortillas. Vegetables Fresh or frozen vegetables (raw, steamed, roasted, or grilled). Low-sodium or reduced-sodium tomato and vegetable juice. Low-sodium or reduced-sodium tomato sauce and tomato paste. Low-sodium or reduced-sodium canned vegetables. Fruits All fresh, dried, or frozen fruit. Canned fruit in natural juice (without  added sugar). Meat and other protein foods Skinless chicken or turkey. Ground chicken or turkey. Pork with fat trimmed off. Fish and seafood. Egg whites. Dried beans, peas, or lentils. Unsalted nuts, nut butters, and seeds. Unsalted canned beans. Lean cuts of beef with fat trimmed off. Low-sodium, lean deli meat. Dairy Low-fat (1%) or fat-free (skim) milk. Fat-free, low-fat, or reduced-fat cheeses. Nonfat, low-sodium ricotta or cottage cheese. Low-fat or nonfat yogurt. Low-fat, low-sodium cheese. Fats and oils Soft margarine without trans fats. Vegetable oil. Low-fat, reduced-fat, or light mayonnaise and salad dressings (reduced-sodium). Canola, safflower, olive, soybean, and sunflower oils. Avocado. Seasoning and other foods Herbs. Spices. Seasoning mixes without salt. Unsalted popcorn and pretzels. Fat-free sweets. What foods are not recommended? The items listed may not be a complete list. Talk with your dietitian about what dietary choices are best for you. Grains Baked goods made with fat, such as croissants, muffins, or some breads. Dry pasta or rice meal packs. Vegetables Creamed or fried vegetables. Vegetables in a cheese sauce. Regular canned vegetables (not low-sodium or reduced-sodium). Regular canned tomato sauce and paste (not low-sodium or reduced-sodium). Regular tomato and vegetable juice (not low-sodium or reduced-sodium). Pickles. Olives. Fruits Canned fruit in a light or heavy syrup. Fried fruit. Fruit in cream or butter sauce. Meat and other protein foods Fatty cuts of meat. Ribs. Fried meat. Bacon. Sausage. Bologna and other processed lunch meats. Salami. Fatback. Hotdogs. Bratwurst. Salted nuts and seeds. Canned beans with added salt. Canned or smoked fish. Whole eggs or egg yolks. Chicken or turkey with skin. Dairy Whole or 2% milk, cream, and half-and-half. Whole or full-fat cream cheese. Whole-fat or sweetened yogurt. Full-fat cheese. Nondairy creamers. Whipped toppings.  Processed cheese and cheese spreads. Fats and oils Butter. Stick margarine. Lard. Shortening. Ghee. Bacon fat. Tropical oils, such as coconut, palm kernel, or palm oil. Seasoning and other foods Salted popcorn and pretzels. Onion salt, garlic salt, seasoned salt, table salt, and sea salt. Worcestershire sauce. Tartar sauce. Barbecue sauce. Teriyaki sauce. Soy sauce, including reduced-sodium. Steak sauce. Canned and packaged gravies. Fish sauce. Oyster sauce. Cocktail sauce. Horseradish that you find on the shelf. Ketchup. Mustard. Meat flavorings and tenderizers. Bouillon cubes. Hot sauce and Tabasco sauce. Premade or packaged marinades. Premade or packaged taco seasonings. Relishes. Regular salad dressings. Where to find more information:  National Heart, Lung, and Blood Institute: www.nhlbi.nih.gov  American Heart Association: www.heart.org Summary  The DASH eating plan is a healthy eating plan that has been shown to reduce high blood pressure (hypertension). It may also reduce your risk for type 2 diabetes, heart disease, and stroke.  With the DASH eating plan, you should limit salt (sodium) intake to 2,300 mg a day. If you have hypertension, you may need to reduce your sodium intake to 1,500 mg a day.  When on the DASH eating plan, aim to eat more fresh fruits and vegetables, whole grains, lean proteins, low-fat dairy, and heart-healthy fats.  Work with your health care provider or diet and nutrition specialist (dietitian) to adjust your eating plan to your   individual calorie needs. This information is not intended to replace advice given to you by your health care provider. Make sure you discuss any questions you have with your health care provider. Document Revised: 04/12/2017 Document Reviewed: 04/23/2016 Elsevier Patient Education  2020 Elsevier Inc.  

## 2020-01-13 ENCOUNTER — Encounter: Payer: Self-pay | Admitting: Family Medicine

## 2020-01-13 DIAGNOSIS — E1121 Type 2 diabetes mellitus with diabetic nephropathy: Secondary | ICD-10-CM | POA: Insufficient documentation

## 2020-01-13 HISTORY — DX: Type 2 diabetes mellitus with diabetic nephropathy: E11.21

## 2020-01-13 LAB — MICROALBUMIN / CREATININE URINE RATIO
Creatinine, Urine: 52.1 mg/dL
Microalb/Creat Ratio: 59 mg/g creat — ABNORMAL HIGH (ref 0–29)
Microalbumin, Urine: 30.5 ug/mL

## 2020-02-11 ENCOUNTER — Ambulatory Visit (INDEPENDENT_AMBULATORY_CARE_PROVIDER_SITE_OTHER): Payer: 59 | Admitting: Family Medicine

## 2020-02-11 VITALS — BP 132/90

## 2020-02-11 DIAGNOSIS — I10 Essential (primary) hypertension: Secondary | ICD-10-CM

## 2020-02-11 NOTE — Progress Notes (Signed)
Virtual Visit via Telephone Note  I connected with Nicole Roth on 02/11/20 at 10:44 AM by telephone and verified that I am speaking with the correct person using two identifiers. Nicole Roth is currently located at work and nobody is currently with her during this visit. The provider, Gwenlyn Fudge, FNP is located in their office at time of visit.  I discussed the limitations, risks, security and privacy concerns of performing an evaluation and management service by telephone and the availability of in person appointments. I also discussed with the patient that there may be a patient responsible charge related to this service. The patient expressed understanding and agreed to proceed.  Subjective: PCP: Gwenlyn Fudge, FNP  Chief Complaint  Patient presents with  . Hypertension   Patient is following up on hypertension. At her last visit she was started on amlodipine 5 mg once daily. She has been monitoring her BP at home and reports the highest reading she has got was 152/90, the lowest has been 113/76 but she is mostly in the 130s/80s. Today while on the phone her BP is 132/90.    ROS: Per HPI  Current Outpatient Medications:  .  amLODipine (NORVASC) 5 MG tablet, Take 1 tablet (5 mg total) by mouth daily., Disp: 30 tablet, Rfl: 2 .  Famotidine (PEPCID PO), Take by mouth daily., Disp: , Rfl:  .  loratadine (CLARITIN) 10 MG tablet, Take 10 mg by mouth in the morning and at bedtime., Disp: , Rfl:  .  metFORMIN (GLUCOPHAGE XR) 500 MG 24 hr tablet, Take 2 tablets (1,000 mg total) by mouth 2 (two) times daily with a meal., Disp: 360 tablet, Rfl: 1 .  pravastatin (PRAVACHOL) 40 MG tablet, Take 1 tablet (40 mg total) by mouth daily., Disp: 30 tablet, Rfl: 2  Allergies  Allergen Reactions  . Bee Pollen   . Bee Venom Hives  . Haemophilus Influenzae Vaccines Hives  . Lisinopril Swelling    Lip swelling.  . Simvastatin Other (See Comments)    Leg cramps & fatigue.   . Sulfa Antibiotics  Hives   Past Medical History:  Diagnosis Date  . Diabetes mellitus (HCC)   . Diabetic nephropathy (HCC) 01/13/2020  . History of kidney stones    passes stone - no surgery required  . Hypertension   . Mixed hyperlipidemia   . Seasonal allergies     Observations/Objective: A&O  No respiratory distress or wheezing audible over the phone Mood, judgement, and thought processes all WNL   Assessment and Plan: 1. Essential hypertension - Well controlled on current regimen.    Follow Up Instructions: Return in about 2 months (around 04/12/2020) for DM.  I discussed the assessment and treatment plan with the patient. The patient was provided an opportunity to ask questions and all were answered. The patient agreed with the plan and demonstrated an understanding of the instructions.   The patient was advised to call back or seek an in-person evaluation if the symptoms worsen or if the condition fails to improve as anticipated.  The above assessment and management plan was discussed with the patient. The patient verbalized understanding of and has agreed to the management plan. Patient is aware to call the clinic if symptoms persist or worsen. Patient is aware when to return to the clinic for a follow-up visit. Patient educated on when it is appropriate to go to the emergency department.   Time call ended: 10:54 AM  I provided 12 minutes of non-face-to-face  time during this encounter.  Nicole Boston, MSN, APRN, FNP-C [facility] 02/11/20

## 2020-02-14 ENCOUNTER — Encounter: Payer: Self-pay | Admitting: Family Medicine

## 2020-04-04 ENCOUNTER — Other Ambulatory Visit: Payer: Self-pay | Admitting: Family Medicine

## 2020-04-04 DIAGNOSIS — I10 Essential (primary) hypertension: Secondary | ICD-10-CM

## 2020-04-06 ENCOUNTER — Encounter: Payer: Self-pay | Admitting: *Deleted

## 2020-05-10 ENCOUNTER — Other Ambulatory Visit: Payer: Self-pay | Admitting: Family Medicine

## 2020-05-10 DIAGNOSIS — I10 Essential (primary) hypertension: Secondary | ICD-10-CM

## 2020-05-12 ENCOUNTER — Ambulatory Visit: Payer: 59 | Admitting: Family Medicine

## 2020-05-26 ENCOUNTER — Ambulatory Visit (INDEPENDENT_AMBULATORY_CARE_PROVIDER_SITE_OTHER): Payer: 59 | Admitting: Family Medicine

## 2020-05-26 ENCOUNTER — Encounter: Payer: Self-pay | Admitting: Family Medicine

## 2020-05-26 DIAGNOSIS — E782 Mixed hyperlipidemia: Secondary | ICD-10-CM

## 2020-05-26 DIAGNOSIS — E1121 Type 2 diabetes mellitus with diabetic nephropathy: Secondary | ICD-10-CM

## 2020-05-26 DIAGNOSIS — I10 Essential (primary) hypertension: Secondary | ICD-10-CM | POA: Diagnosis not present

## 2020-05-26 MED ORDER — LOSARTAN POTASSIUM 25 MG PO TABS: 25.0000 mg | ORAL_TABLET | Freq: Every day | ORAL | 2 refills | Status: DC

## 2020-05-26 MED ORDER — METFORMIN HCL ER 500 MG PO TB24: 1000.0000 mg | ORAL_TABLET | Freq: Two times a day (BID) | ORAL | 1 refills | Status: AC

## 2020-05-26 MED ORDER — AMLODIPINE BESYLATE 5 MG PO TABS: 5.0000 mg | ORAL_TABLET | Freq: Every day | ORAL | 1 refills | Status: DC

## 2020-05-26 MED ORDER — PRAVASTATIN SODIUM 40 MG PO TABS: 40.0000 mg | ORAL_TABLET | Freq: Every day | ORAL | 1 refills | Status: AC

## 2020-05-26 NOTE — Progress Notes (Signed)
Virtual Visit via Telephone Note  I connected with Nicole Roth on 05/26/20 at 1:36 PM by telephone and verified that I am speaking with the correct person using two identifiers. Nicole Roth is currently located at work and nobody is currently with her during this visit. The provider, Loman Brooklyn, FNP is located in their home at time of visit.  I discussed the limitations, risks, security and privacy concerns of performing an evaluation and management service by telephone and the availability of in person appointments. I also discussed with the patient that there may be a patient responsible charge related to this service. The patient expressed understanding and agreed to proceed.  Subjective: PCP: Loman Brooklyn, FNP  Chief Complaint  Patient presents with  . Diabetes   Diabetes: Patient presents for follow up of diabetes. Current symptoms include: hyperglycemia. Known diabetic complications: nephropathy. Medication compliance: yes. Current diet: in general, a "healthy" diet  . 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 Home blood sugar records: 110s fasting, 160-180 during the day. Is she  on ACE inhibitor or angiotensin II receptor blocker? No. Is she on a statin? Yes - she is only taking it every other day because it makes her feel sluggish.   Lab Results  Component Value Date   HGBA1C 9.1 (H) 01/12/2020   HGBA1C 13.0 (H) 09/25/2019   Lab Results  Component Value Date   LDLCALC 145 (H) 09/25/2019   CREATININE 0.87 01/12/2020     ROS: Per HPI  Current Outpatient Medications:  .  amLODipine (NORVASC) 5 MG tablet, Take 1 tablet by mouth once daily, Disp: 30 tablet, Rfl: 0 .  Famotidine (PEPCID PO), Take by mouth daily., Disp: , Rfl:  .  loratadine (CLARITIN) 10 MG tablet, Take 10 mg by mouth in the morning and at bedtime., Disp: , Rfl:  .  metFORMIN (GLUCOPHAGE XR) 500 MG 24 hr tablet, Take 2 tablets (1,000 mg total) by mouth 2 (two) times daily with a meal.,  Disp: 360 tablet, Rfl: 1 .  pravastatin (PRAVACHOL) 40 MG tablet, Take 1 tablet (40 mg total) by mouth daily., Disp: 30 tablet, Rfl: 2  Allergies  Allergen Reactions  . Bee Pollen   . Bee Venom Hives  . Haemophilus Influenzae Vaccines Hives  . Lisinopril Swelling    Lip swelling.  . Simvastatin Other (See Comments)    Leg cramps & fatigue.   . Sulfa Antibiotics Hives   Past Medical History:  Diagnosis Date  . Diabetes mellitus ([place])   . Diabetic nephropathy ([place]) 01/13/2020  . History of kidney stones    passes stone - no surgery required  . Hypertension   . Mixed hyperlipidemia   . Seasonal allergies     Observations/Objective: A&O  No respiratory distress or wheezing audible over the phone Mood, judgement, and thought processes all WNL  Assessment and Plan: 1. Type 2 diabetes mellitus with diabetic nephropathy, without long-term current use of insulin (HCC) - Diabetes control seems to be improving based on fasting blood sugars.  She will come to the office for A1c, as this was a telephone visit. - Medications: Continue current medications until A1c results - Home glucose monitoring: Continue monitoring - Patient is currently taking a statin. Patient is taking an ACE-inhibitor/ARB.  - Last foot exam: 01/12/2020 - Last diabetic eye exam: Never - Urine Microalbumin/Creat Ratio: 01/12/2020 - Instruction/counseling given: reminded to get eye exam and discussed the need for weight loss - CBC with  Differential/Platelet; Future - CMP14+EGFR; Future - Lipid panel; Future - Bayer DCA Hb A1c Waived; Future - metFORMIN (GLUCOPHAGE XR) 500 MG 24 hr tablet; Take 2 tablets (1,000 mg total) by mouth 2 (two) times daily with a meal.  Dispense: 360 tablet; Refill: 1 - pravastatin (PRAVACHOL) 40 MG tablet; Take 1 tablet (40 mg total) by mouth daily.  Dispense: 90 tablet; Refill: 1  2. Diabetic nephropathy associated with type 2 diabetes mellitus ([place]) - Started patient on losartan today.   Working on control of diabetes. - losartan (COZAAR) 25 MG tablet; Take 1 tablet (25 mg total) by mouth daily.  Dispense: 30 tablet; Refill: 2  3. Mixed hyperlipidemia - Labs to assess.  Continue pravastatin, diet, and exercise. - CMP14+EGFR; Future - Lipid panel; Future - pravastatin (PRAVACHOL) 40 MG tablet; Take 1 tablet (40 mg total) by mouth daily.  Dispense: 90 tablet; Refill: 1   Follow Up Instructions: Return for As directed after labs result.  I discussed the assessment and treatment plan with the patient. The patient was provided an opportunity to ask questions and all were answered. The patient agreed with the plan and demonstrated an understanding of the instructions.   The patient was advised to call back or seek an in-person evaluation if the symptoms worsen or if the condition fails to improve as anticipated.  The above assessment and management plan was discussed with the patient. The patient verbalized understanding of and has agreed to the management plan. Patient is aware to call the clinic if symptoms persist or worsen. Patient is aware when to return to the clinic for a follow-up visit. Patient educated on when it is appropriate to go to the emergency department.   Time call ended: 1:53 PM  I provided 19 minutes of non-face-to-face time during this encounter.  Hendricks Limes, MSN, APRN, FNP-C [facility] 05/26/20

## 2020-05-27 ENCOUNTER — Other Ambulatory Visit: Payer: Self-pay

## 2020-05-27 ENCOUNTER — Other Ambulatory Visit: Payer: 59

## 2020-05-27 DIAGNOSIS — E782 Mixed hyperlipidemia: Secondary | ICD-10-CM

## 2020-05-27 DIAGNOSIS — E1121 Type 2 diabetes mellitus with diabetic nephropathy: Secondary | ICD-10-CM

## 2020-05-27 LAB — BAYER DCA HB A1C WAIVED: HB A1C (BAYER DCA - WAIVED): 7.5 % — ABNORMAL HIGH (ref <7.0)

## 2020-05-28 LAB — CMP14+EGFR
ALT: 15 IU/L (ref 0–32)
AST: 13 IU/L (ref 0–40)
Albumin/Globulin Ratio: 1.9 (ref 1.2–2.2)
Albumin: 4.6 g/dL (ref 3.8–4.9)
Alkaline Phosphatase: 68 IU/L (ref 44–121)
BUN/Creatinine Ratio: 26 — ABNORMAL HIGH (ref 9–23)
BUN: 19 mg/dL (ref 6–24)
Bilirubin Total: <0.2 mg/dL (ref 0.0–1.2)
CO2: 22 mmol/L (ref 20–29)
Calcium: 9.6 mg/dL (ref 8.7–10.2)
Chloride: 105 mmol/L (ref 96–106)
Creatinine, Ser: 0.74 mg/dL (ref 0.57–1.00)
GFR calc Af Amer: 107 mL/min/{1.73_m2} (ref >59)
GFR calc non Af Amer: 93 mL/min/{1.73_m2} (ref >59)
Globulin, Total: 2.4 g/dL (ref 1.5–4.5)
Glucose: 144 mg/dL — ABNORMAL HIGH (ref 65–99)
Potassium: 4.5 mmol/L (ref 3.5–5.2)
Sodium: 142 mmol/L (ref 134–144)
Total Protein: 7 g/dL (ref 6.0–8.5)

## 2020-05-28 LAB — CBC WITH DIFFERENTIAL/PLATELET
Basophils Absolute: 0.1 10*3/uL (ref 0.0–0.2)
Basos: 1 %
EOS (ABSOLUTE): 0.2 10*3/uL (ref 0.0–0.4)
Eos: 2 %
Hematocrit: 43.6 % (ref 34.0–46.6)
Hemoglobin: 14.3 g/dL (ref 11.1–15.9)
Immature Grans (Abs): 0 10*3/uL (ref 0.0–0.1)
Immature Granulocytes: 0 %
Lymphocytes Absolute: 3.4 10*3/uL — ABNORMAL HIGH (ref 0.7–3.1)
Lymphs: 32 %
MCH: 30.8 pg (ref 26.6–33.0)
MCHC: 32.8 g/dL (ref 31.5–35.7)
MCV: 94 fL (ref 79–97)
Monocytes Absolute: 0.7 10*3/uL (ref 0.1–0.9)
Monocytes: 7 %
Neutrophils Absolute: 6.2 10*3/uL (ref 1.4–7.0)
Neutrophils: 58 %
Platelets: 274 10*3/uL (ref 150–450)
RBC: 4.65 x10E6/uL (ref 3.77–5.28)
RDW: 12.5 % (ref 11.7–15.4)
WBC: 10.6 10*3/uL (ref 3.4–10.8)

## 2020-05-28 LAB — LIPID PANEL
Chol/HDL Ratio: 4.2 ratio (ref 0.0–4.4)
Cholesterol, Total: 184 mg/dL (ref 100–199)
HDL: 44 mg/dL (ref >39)
LDL Chol Calc (NIH): 122 mg/dL — ABNORMAL HIGH (ref 0–99)
Triglycerides: 99 mg/dL (ref 0–149)
VLDL Cholesterol Cal: 18 mg/dL (ref 5–40)

## 2020-05-31 ENCOUNTER — Other Ambulatory Visit: Payer: Self-pay | Admitting: Family Medicine

## 2020-06-01 ENCOUNTER — Ambulatory Visit (INDEPENDENT_AMBULATORY_CARE_PROVIDER_SITE_OTHER): Payer: 59

## 2020-06-01 DIAGNOSIS — G720 Drug-induced myopathy: Secondary | ICD-10-CM

## 2020-06-01 DIAGNOSIS — E119 Type 2 diabetes mellitus without complications: Secondary | ICD-10-CM

## 2020-06-01 MED ORDER — PIOGLITAZONE HCL 15 MG PO TABS: 15.0000 mg | ORAL_TABLET | Freq: Every day | ORAL | 3 refills | Status: DC

## 2020-06-01 NOTE — Progress Notes (Signed)
06/01/2020 Name: Nicole Roth MRN: [id number] DOB: Feb 17, 1966  S:  15 yoF presents for diabetes evaluation, education, and management Patient was referred and last seen by Primary Care Provider on 05/31/20. Patient reports Diabetes was diagnosed (may 14th 2021).  Patient visit virtually in the context of Covid-19 pandemic.   I connected with Kerry Kass  on 06/01/20 at 3pm by video and verified that I am speaking with the correct person using two identifiers.   I discussed the limitations, risks, security and privacy concerns of performing an evaluation and management service by video and the availability of in person appointments. I also discussed with the patient that there may be a patient responsible charge related to this service. The patient expressed understanding and agreed to proceed.   Patient location:  home My Location:  home Persons on the video call:  2  Insurance coverage/medication affordability: [employer] commercial (high deductible plan)  Current diabetes medications include: metformin  Current hypertension medications include:amlodipine, losartan Goal 130/80  Current hyperlipidemia medications include: simvastatin-->pravastatin LDL down to 122 STATIN ALLERGY/INTOLERANCE DOCUMENTED IN EMR : G72 intolerance to simvastatin  Patient reported dietary habits: Eats 2-3 meals/day   Discussed meal planning options and Plate method for health eating  Avoid sugary drinks and desserts  Incorporate balanced protein, non starchy veggies, 1 serving of carbohydrate  Increase water intake  Increase physical activity as able.  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    Clinical Atherosclerotic Cardiovascular Disease (ASCVD): No   The 10-year ASCVD risk score Denman George DC Jr., et al., 2013) is: 13.1%   Values used to calculate the score:     Age: 55 years     Sex: Female     Is Non-Hispanic African American: Yes     Diabetic: Yes     Tobacco smoker: No     Systolic Blood Pressure: 132 mmHg     Is BP treated: Yes     HDL Cholesterol: 44 mg/dL     Total Cholesterol: 184 mg/dL    A/P:  Diabetes Y5WL, A1c has vastly improved.  Patient is adherent with medication.   -OF note, patient has [employer] ($8000 high deductible plan).  Medication options will be very limited due to this circumstance  -INCREASED metfomin XR to 1000mg  BID with meals --tolerating well  -Patient does not wish to start SGLT2 ([place]) due to cost, despite coupon.  She continues to get her medications at cheaper on cash.  -Will start pioglitazone 15mg  daily based on allergies and cost  -PCP transitioned patient to pravastatin 40mg  nigtly.  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  Will titrate medication to goal as tolerated.  LDL has decreased since last visit!  -Extensively discussed pathophysiology of diabetes, recommended lifestyle interventions, dietary effects on blood sugar control  -Counseled on s/sx of and management of hypoglycemia  -Next A1C anticipated 3 months  Patient instructions provided.  Total time in counseling 20 minutes.   Follow up  PCP Clinic Visit on 09/28/20.    Kieth Brightly, PharmD, BCPS Clinical Pharmacist, [facility]  II Phone [phone]

## 2020-06-06 DIAGNOSIS — G720 Drug-induced myopathy: Secondary | ICD-10-CM | POA: Insufficient documentation

## 2020-07-04 ENCOUNTER — Encounter: Payer: Self-pay | Admitting: Family Medicine

## 2020-07-04 DIAGNOSIS — I10 Essential (primary) hypertension: Secondary | ICD-10-CM

## 2020-07-04 MED ORDER — AMLODIPINE BESYLATE 5 MG PO TABS: 10.0000 mg | ORAL_TABLET | Freq: Every day | ORAL | 1 refills | Status: DC

## 2020-09-28 ENCOUNTER — Ambulatory Visit (INDEPENDENT_AMBULATORY_CARE_PROVIDER_SITE_OTHER): Payer: 59 | Admitting: Family Medicine

## 2020-09-28 ENCOUNTER — Other Ambulatory Visit: Payer: Self-pay

## 2020-09-28 ENCOUNTER — Encounter: Payer: Self-pay | Admitting: Family Medicine

## 2020-09-28 VITALS — BP 170/93 | HR 106 | Temp 97.5°F | Ht <= 58 in | Wt 144.2 lb

## 2020-09-28 DIAGNOSIS — E782 Mixed hyperlipidemia: Secondary | ICD-10-CM | POA: Diagnosis not present

## 2020-09-28 DIAGNOSIS — E1121 Type 2 diabetes mellitus with diabetic nephropathy: Secondary | ICD-10-CM | POA: Diagnosis not present

## 2020-09-28 DIAGNOSIS — J302 Other seasonal allergic rhinitis: Secondary | ICD-10-CM

## 2020-09-28 DIAGNOSIS — E1165 Type 2 diabetes mellitus with hyperglycemia: Secondary | ICD-10-CM | POA: Diagnosis not present

## 2020-09-28 DIAGNOSIS — I10 Essential (primary) hypertension: Secondary | ICD-10-CM | POA: Diagnosis not present

## 2020-09-28 LAB — BAYER DCA HB A1C WAIVED: HB A1C (BAYER DCA - WAIVED): 8.6 % — ABNORMAL HIGH (ref <7.0)

## 2020-09-28 MED ORDER — LOSARTAN POTASSIUM 25 MG PO TABS: 25.0000 mg | ORAL_TABLET | Freq: Every day | ORAL | 1 refills | Status: AC

## 2020-09-28 MED ORDER — AMLODIPINE BESYLATE 10 MG PO TABS: 10.0000 mg | ORAL_TABLET | Freq: Every day | ORAL | 1 refills | Status: AC

## 2020-09-28 MED ORDER — PIOGLITAZONE HCL 30 MG PO TABS: 30.0000 mg | ORAL_TABLET | Freq: Every day | ORAL | 2 refills | Status: AC

## 2020-09-28 NOTE — Progress Notes (Signed)
Assessment & Plan:  1. Type 2 diabetes mellitus with hyperglycemia, without long-term current use of insulin (HCC) Lab Results  Component Value Date   HGBA1C 8.6 (H) 09/28/2020   HGBA1C 7.5 (H) 05/27/2020   HGBA1C 9.1 (H) 01/12/2020    - Diabetes is not at goal of A1c < 7. - Medications: continue Metformin 1,000 mg BID; increase Actos from 15 mg to 30 mg once daily - Home glucose monitoring: continue monitoring - Patient is currently taking a statin. Patient is taking an ACE-inhibitor/ARB.  - Instruction/counseling given: discussed diet and provided printed educational material  - Patient reports she had a diabetic eye exam but can't remember where. She will look for this information at home and send a MyChart message.   Diabetes Health Maintenance Due  Topic Date Due  . OPHTHALMOLOGY EXAM  Never done  . FOOT EXAM  01/11/2021  . HEMOGLOBIN A1C  03/31/2021    Lab Results  Component Value Date   LABMICR 30.5 01/12/2020   - Lipid panel - CBC with Differential/Platelet - CMP14+EGFR - Bayer DCA Hb A1c Waived - pioglitazone (ACTOS) 30 MG tablet; Take 1 tablet (30 mg total) by mouth daily.  Dispense: 30 tablet; Refill: 2  2. Diabetic nephropathy associated with type 2 diabetes mellitus ([place]) On ARB. Working on better control of diabetes. - losartan (COZAAR) 25 MG tablet; Take 1 tablet (25 mg total) by mouth daily.  Dispense: 90 tablet; Refill: 1  3. Essential hypertension Uncontrolled off of medication. Patient to resume taking and monitor at home. - Lipid panel - CBC with Differential/Platelet - CMP14+EGFR - losartan (COZAAR) 25 MG tablet; Take 1 tablet (25 mg total) by mouth daily.  Dispense: 90 tablet; Refill: 1 - amLODipine (NORVASC) 10 MG tablet; Take 1 tablet (10 mg total) by mouth daily.  Dispense: 90 tablet; Refill: 1  4. Mixed hyperlipidemia Labs to assess. - Lipid panel - CBC with Differential/Platelet  5. Seasonal allergies Advised to only take Claritin or  Allegra - not both. Add Flonase (declined a prescription).    Return in about 6 weeks (around 11/09/2020) for HTN (telephone) and 3 months DM .  Hendricks Limes, MSN, APRN, FNP-C [facility]  Subjective:    Patient ID: Nicole Roth, female    DOB: 06/17/1965, 55 y.o.   MRN: [id number]  Patient Care Team: Loman Brooklyn, FNP as PCP - General (Family Medicine) Lavera Guise, [facility] (Pharmacist)   Chief Complaint:  Chief Complaint  Patient presents with  . Diabetes    Check up of chronic medical conditions   . Allergies    HPI: Nicole Roth is a 55 y.o. female presenting on 09/28/2020 for Diabetes (Check up of chronic medical conditions ) and Allergies  Diabetes: Patient presents for follow up of diabetes. Current symptoms include: hyperglycemia. Known diabetic complications: nephropathy. Medication compliance: yes. Current diet: in general, an "unhealthy" diet. Current exercise: none. Home blood sugar records: patient reports fasting 90-95. Is she  on ACE inhibitor or angiotensin II receptor blocker? Yes. Is she on a statin? Yes.   Hypertension: patient has not been taking her blood pressure medication as she reports she ran out and did not call for a refill because she knew she has an appointment coming up. States she was checking her BP at home and when she was on her medications it was around 110s/70s.   New complaints: Patient reports her allergies are really bad right now. She is taking Claritin in the AM  and Allegra-D in the PM. She does not use any nasal sprays.    Social history:  Relevant past medical, surgical, family and social history reviewed and updated as indicated. Interim medical history since our last visit reviewed.  Allergies and medications reviewed and updated.  DATA REVIEWED: CHART IN EPIC  ROS: Negative unless specifically indicated above in HPI.    Current Outpatient Medications:  .  amLODipine (NORVASC) 5 MG tablet, Take 2 tablets (10  mg total) by mouth daily., Disp: 90 tablet, Rfl: 1 .  Famotidine (PEPCID PO), Take by mouth daily., Disp: , Rfl:  .  loratadine (CLARITIN) 10 MG tablet, Take 10 mg by mouth in the morning and at bedtime., Disp: , Rfl:  .  losartan (COZAAR) 25 MG tablet, Take 1 tablet (25 mg total) by mouth daily., Disp: 30 tablet, Rfl: 2 .  metFORMIN (GLUCOPHAGE XR) 500 MG 24 hr tablet, Take 2 tablets (1,000 mg total) by mouth 2 (two) times daily with a meal., Disp: 360 tablet, Rfl: 1 .  pioglitazone (ACTOS) 15 MG tablet, Take 1 tablet (15 mg total) by mouth daily., Disp: 90 tablet, Rfl: 3 .  pravastatin (PRAVACHOL) 40 MG tablet, Take 1 tablet (40 mg total) by mouth daily., Disp: 90 tablet, Rfl: 1   Allergies  Allergen Reactions  . Bee Pollen   . Bee Venom Hives  . Haemophilus Influenzae Vaccines Hives  . Lisinopril Swelling    Lip swelling.  . Simvastatin Other (See Comments)    Leg cramps & fatigue.   . Sulfa Antibiotics Hives   Past Medical History:  Diagnosis Date  . Diabetes mellitus ([place])   . Diabetic nephropathy ([place]) 01/13/2020  . History of kidney stones    passes stone - no surgery required  . Hypertension   . Mixed hyperlipidemia   . Seasonal allergies     Past Surgical History:  Procedure Laterality Date  . CESAREAN SECTION    . ROBOTIC ASSISTED TOTAL HYSTERECTOMY N/A 10/09/2012   Procedure: ROBOTIC ASSISTED TOTAL HYSTERECTOMY;  Surgeon: Daria Pastures, MD;  Location: [facility];  Service: Gynecology;  Laterality: N/A;  . TUBAL LIGATION    . WISDOM TOOTH EXTRACTION      Social History   Socioeconomic History  . Marital status: Single    Spouse name: Not on file  . Number of children: Not on file  . Years of education: Not on file  . Highest education level: Not on file  Occupational History  . Not on file  Tobacco Use  . Smoking status: Former Smoker    Packs/day: 1.00    Years: 30.00    Pack years: 30.00    Types: Cigarettes    Quit date: 05/15/2015    Years since  quitting: 5.3  . Smokeless tobacco: Never Used  Vaping Use  . Vaping Use: Never used  Substance and Sexual Activity  . Alcohol use: No  . Drug use: No  . Sexual activity: Not Currently    Birth control/protection: Surgical  Other Topics Concern  . Not on file  Social History Narrative  . Not on file   Social Determinants of Health   Financial Resource Strain: Not on file  Food Insecurity: Not on file  Transportation Needs: Not on file  Physical Activity: Not on file  Stress: Not on file  Social Connections: Not on file  Intimate Partner Violence: Not on file        Objective:    BP (!) 170/93  Pulse (!) 106   Temp (!) 97.5 F (36.4 C) (Temporal)   Ht 4' 10"  (1.473 m)   Wt 144 lb 3.2 oz (65.4 kg)   SpO2 95%   BMI 30.14 kg/m   Wt Readings from Last 3 Encounters:  09/28/20 144 lb 3.2 oz (65.4 kg)  01/12/20 130 lb 12.8 oz (59.3 kg)  09/25/19 137 lb 3.2 oz (62.2 kg)    Physical Exam Vitals reviewed.  Constitutional:      General: She is not in acute distress.    Appearance: Normal appearance. She is obese. She is not ill-appearing, toxic-appearing or diaphoretic.  HENT:     Head: Normocephalic and atraumatic.  Eyes:     General: No scleral icterus.       Right eye: No discharge.        Left eye: No discharge.     Conjunctiva/sclera: Conjunctivae normal.     Comments: Xanthelasmas bilaterally.  Cardiovascular:     Rate and Rhythm: Normal rate and regular rhythm.     Heart sounds: Normal heart sounds. No murmur heard. No friction rub. No gallop.   Pulmonary:     Effort: Pulmonary effort is normal. No respiratory distress.     Breath sounds: Normal breath sounds. No stridor. No wheezing, rhonchi or rales.  Musculoskeletal:        General: Normal range of motion.     Cervical back: Normal range of motion.  Skin:    General: Skin is warm and dry.     Capillary Refill: Capillary refill takes less than 2 seconds.  Neurological:     General: No focal deficit  present.     Mental Status: She is alert and oriented to person, place, and time. Mental status is at baseline.  Psychiatric:        Mood and Affect: Mood normal.        Behavior: Behavior normal.        Thought Content: Thought content normal.        Judgment: Judgment normal.     No results found for: TSH Lab Results  Component Value Date   WBC 10.6 05/27/2020   HGB 14.3 05/27/2020   HCT 43.6 05/27/2020   MCV 94 05/27/2020   PLT 274 05/27/2020   Lab Results  Component Value Date   NA 142 05/27/2020   K 4.5 05/27/2020   CO2 22 05/27/2020   GLUCOSE 144 (H) 05/27/2020   BUN 19 05/27/2020   CREATININE 0.74 05/27/2020   BILITOT <0.2 05/27/2020   ALKPHOS 68 05/27/2020   AST 13 05/27/2020   ALT 15 05/27/2020   PROT 7.0 05/27/2020   ALBUMIN 4.6 05/27/2020   CALCIUM 9.6 05/27/2020   Lab Results  Component Value Date   CHOL 184 05/27/2020   Lab Results  Component Value Date   HDL 44 05/27/2020   Lab Results  Component Value Date   LDLCALC 122 (H) 05/27/2020   Lab Results  Component Value Date   TRIG 99 05/27/2020   Lab Results  Component Value Date   CHOLHDL 4.2 05/27/2020   Lab Results  Component Value Date   HGBA1C 7.5 (H) 05/27/2020

## 2020-09-28 NOTE — Patient Instructions (Signed)
Diabetes Mellitus and Nutrition, Adult When you have diabetes, or diabetes mellitus, it is very important to have healthy eating habits because your blood sugar (glucose) levels are greatly affected by what you eat and drink. Eating healthy foods in the right amounts, at about the same times every day, can help you:  Control your blood glucose.  Lower your risk of heart disease.  Improve your blood pressure.  Reach or maintain a healthy weight. What can affect my meal plan? Every person with diabetes is different, and each person has different needs for a meal plan. Your health care provider may recommend that you work with a dietitian to make a meal plan that is best for you. Your meal plan may vary depending on factors such as:  The calories you need.  The medicines you take.  Your weight.  Your blood glucose, blood pressure, and cholesterol levels.  Your activity level.  Other health conditions you have, such as heart or kidney disease. How do carbohydrates affect me? Carbohydrates, also called carbs, affect your blood glucose level more than any other type of food. Eating carbs naturally raises the amount of glucose in your blood. Carb counting is a method for keeping track of how many carbs you eat. Counting carbs is important to keep your blood glucose at a healthy level, especially if you use insulin or take certain oral diabetes medicines. It is important to know how many carbs you can safely have in each meal. This is different for every person. Your dietitian can help you calculate how many carbs you should have at each meal and for each snack. How does alcohol affect me? Alcohol can cause a sudden decrease in blood glucose (hypoglycemia), especially if you use insulin or take certain oral diabetes medicines. Hypoglycemia can be a life-threatening condition. Symptoms of hypoglycemia, such as sleepiness, dizziness, and confusion, are similar to symptoms of having too much  alcohol.  Do not drink alcohol if: ? Your health care provider tells you not to drink. ? You are pregnant, may be pregnant, or are planning to become pregnant.  If you drink alcohol: ? Do not drink on an empty stomach. ? Limit how much you use to:  0-1 drink a day for women.  0-2 drinks a day for men. ? Be aware of how much alcohol is in your drink. In the [place] one drink equals one 12 oz bottle of beer (355 mL), one 5 oz glass of wine (148 mL), or one 1 oz glass of hard liquor (44 mL). ? Keep yourself hydrated with water, diet soda, or unsweetened iced tea.  Keep in mind that regular soda, juice, and other mixers may contain a lot of sugar and must be counted as carbs. What are tips for following this plan? Reading food labels  Start by checking the serving size on the "Nutrition Facts" label of packaged foods and drinks. The amount of calories, carbs, fats, and other nutrients listed on the label is based on one serving of the item. Many items contain more than one serving per package.  Check the total grams (g) of carbs in one serving. You can calculate the number of servings of carbs in one serving by dividing the total carbs by 15. For example, if a food has 30 g of total carbs per serving, it would be equal to 2 servings of carbs.  Check the number of grams (g) of saturated fats and trans fats in one serving. Choose foods that have   a low amount or none of these fats.  Check the number of milligrams (mg) of salt (sodium) in one serving. Most people should limit total sodium intake to less than 2,300 mg per day.  Always check the nutrition information of foods labeled as "low-fat" or "nonfat." These foods may be higher in added sugar or refined carbs and should be avoided.  Talk to your dietitian to identify your daily goals for nutrients listed on the label. Shopping  Avoid buying canned, pre-made, or processed foods. These foods tend to be high in fat, sodium, and added  sugar.  Shop around the outside edge of the grocery store. This is where you will most often find fresh fruits and vegetables, bulk grains, fresh meats, and fresh dairy. Cooking  Use low-heat cooking methods, such as baking, instead of high-heat cooking methods like deep frying.  Cook using healthy oils, such as olive, canola, or sunflower oil.  Avoid cooking with butter, cream, or high-fat meats. Meal planning  Eat meals and snacks regularly, preferably at the same times every day. Avoid going long periods of time without eating.  Eat foods that are high in fiber, such as fresh fruits, vegetables, beans, and whole grains. Talk with your dietitian about how many servings of carbs you can eat at each meal.  Eat 4-6 oz (112-168 g) of lean protein each day, such as lean meat, chicken, fish, eggs, or tofu. One ounce (oz) of lean protein is equal to: ? 1 oz (28 g) of meat, chicken, or fish. ? 1 egg. ?  cup (62 g) of tofu.  Eat some foods each day that contain healthy fats, such as avocado, nuts, seeds, and fish.   What foods should I eat? Fruits Berries. Apples. Oranges. Peaches. Apricots. Plums. Grapes. Mango. Papaya. Pomegranate. Kiwi. Cherries. Vegetables Lettuce. Spinach. Leafy greens, including kale, chard, collard greens, and mustard greens. Beets. Cauliflower. Cabbage. Broccoli. Carrots. Green beans. Tomatoes. Peppers. Onions. Cucumbers. Brussels sprouts. Grains Whole grains, such as whole-wheat or whole-grain bread, crackers, tortillas, cereal, and pasta. Unsweetened oatmeal. Quinoa. Brown or wild rice. Meats and other proteins Seafood. Poultry without skin. Lean cuts of poultry and beef. Tofu. Nuts. Seeds. Dairy Low-fat or fat-free dairy products such as milk, yogurt, and cheese. The items listed above may not be a complete list of foods and beverages you can eat. Contact a dietitian for more information. What foods should I avoid? Fruits Fruits canned with  syrup. Vegetables Canned vegetables. Frozen vegetables with butter or cream sauce. Grains Refined white flour and flour products such as bread, pasta, snack foods, and cereals. Avoid all processed foods. Meats and other proteins Fatty cuts of meat. Poultry with skin. Breaded or fried meats. Processed meat. Avoid saturated fats. Dairy Full-fat yogurt, cheese, or milk. Beverages Sweetened drinks, such as soda or iced tea. The items listed above may not be a complete list of foods and beverages you should avoid. Contact a dietitian for more information. Questions to ask a health care provider  Do I need to meet with a diabetes educator?  Do I need to meet with a dietitian?  What number can I call if I have questions?  When are the best times to check my blood glucose? Where to find more information:  American Diabetes Association: diabetes.org  Academy of Nutrition and Dietetics: www.eatright.org  National Institute of Diabetes and Digestive and Kidney Diseases: www.niddk.nih.gov  Association of Diabetes Care and Education Specialists: www.diabeteseducator.org Summary  It is important to have healthy eating   habits because your blood sugar (glucose) levels are greatly affected by what you eat and drink.  A healthy meal plan will help you control your blood glucose and maintain a healthy lifestyle.  Your health care provider may recommend that you work with a dietitian to make a meal plan that is best for you.  Keep in mind that carbohydrates (carbs) and alcohol have immediate effects on your blood glucose levels. It is important to count carbs and to use alcohol carefully. This information is not intended to replace advice given to you by your health care provider. Make sure you discuss any questions you have with your health care provider. Document Revised: 04/07/2019 Document Reviewed: 04/07/2019 Elsevier Patient Education  2021 Elsevier Inc.  

## 2020-09-29 ENCOUNTER — Encounter: Payer: Self-pay | Admitting: Family Medicine

## 2020-09-29 LAB — CBC WITH DIFFERENTIAL/PLATELET
Basophils Absolute: 0.1 10*3/uL (ref 0.0–0.2)
Basos: 1 %
EOS (ABSOLUTE): 0.2 10*3/uL (ref 0.0–0.4)
Eos: 2 %
Hematocrit: 43.8 % (ref 34.0–46.6)
Hemoglobin: 14.6 g/dL (ref 11.1–15.9)
Immature Grans (Abs): 0.1 10*3/uL (ref 0.0–0.1)
Immature Granulocytes: 1 %
Lymphocytes Absolute: 3 10*3/uL (ref 0.7–3.1)
Lymphs: 28 %
MCH: 31.4 pg (ref 26.6–33.0)
MCHC: 33.3 g/dL (ref 31.5–35.7)
MCV: 94 fL (ref 79–97)
Monocytes Absolute: 0.7 10*3/uL (ref 0.1–0.9)
Monocytes: 6 %
Neutrophils Absolute: 6.5 10*3/uL (ref 1.4–7.0)
Neutrophils: 62 %
Platelets: 256 10*3/uL (ref 150–450)
RBC: 4.65 x10E6/uL (ref 3.77–5.28)
RDW: 12.4 % (ref 11.7–15.4)
WBC: 10.6 10*3/uL (ref 3.4–10.8)

## 2020-09-29 LAB — CMP14+EGFR
ALT: 21 IU/L (ref 0–32)
AST: 16 IU/L (ref 0–40)
Albumin/Globulin Ratio: 1.7 (ref 1.2–2.2)
Albumin: 5 g/dL — ABNORMAL HIGH (ref 3.8–4.9)
Alkaline Phosphatase: 81 IU/L (ref 44–121)
BUN/Creatinine Ratio: 21 (ref 9–23)
BUN: 13 mg/dL (ref 6–24)
Bilirubin Total: <0.2 mg/dL (ref 0.0–1.2)
CO2: 23 mmol/L (ref 20–29)
Calcium: 9.9 mg/dL (ref 8.7–10.2)
Chloride: 101 mmol/L (ref 96–106)
Creatinine, Ser: 0.63 mg/dL (ref 0.57–1.00)
Globulin, Total: 2.9 g/dL (ref 1.5–4.5)
Glucose: 145 mg/dL — ABNORMAL HIGH (ref 65–99)
Potassium: 4.2 mmol/L (ref 3.5–5.2)
Sodium: 142 mmol/L (ref 134–144)
Total Protein: 7.9 g/dL (ref 6.0–8.5)
eGFR: 105 mL/min/{1.73_m2} (ref >59)

## 2020-09-29 LAB — LIPID PANEL
Chol/HDL Ratio: 2.5 ratio (ref 0.0–4.4)
Cholesterol, Total: 145 mg/dL (ref 100–199)
HDL: 59 mg/dL (ref >39)
LDL Chol Calc (NIH): 75 mg/dL (ref 0–99)
Triglycerides: 53 mg/dL (ref 0–149)
VLDL Cholesterol Cal: 11 mg/dL (ref 5–40)

## 2021-06-23 ENCOUNTER — Telehealth: Payer: Self-pay | Admitting: Family Medicine

## 2021-06-23 NOTE — Telephone Encounter (Signed)
Left message to call back and schedule diabetes recheck

## 2022-06-09 IMAGING — MG DIGITAL SCREENING BILAT W/ TOMO W/ CAD
6 of 10 series · 6 of 30 positions shown · non-contrast
Comparison: Previous exam(s).

CLINICAL DATA: Screening.

EXAM:
DIGITAL SCREENING BILATERAL MAMMOGRAM WITH TOMO AND CAD

[R CC synth-2D (1 of 2)]
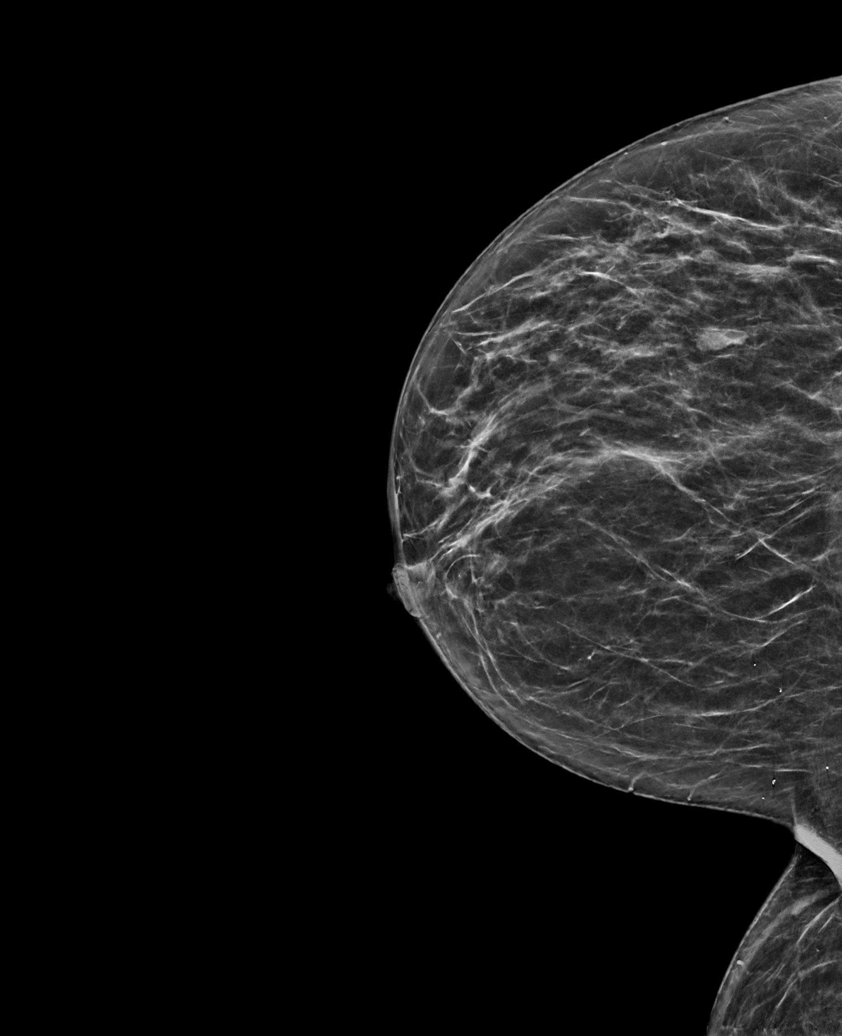

[L MLO synth-2D]
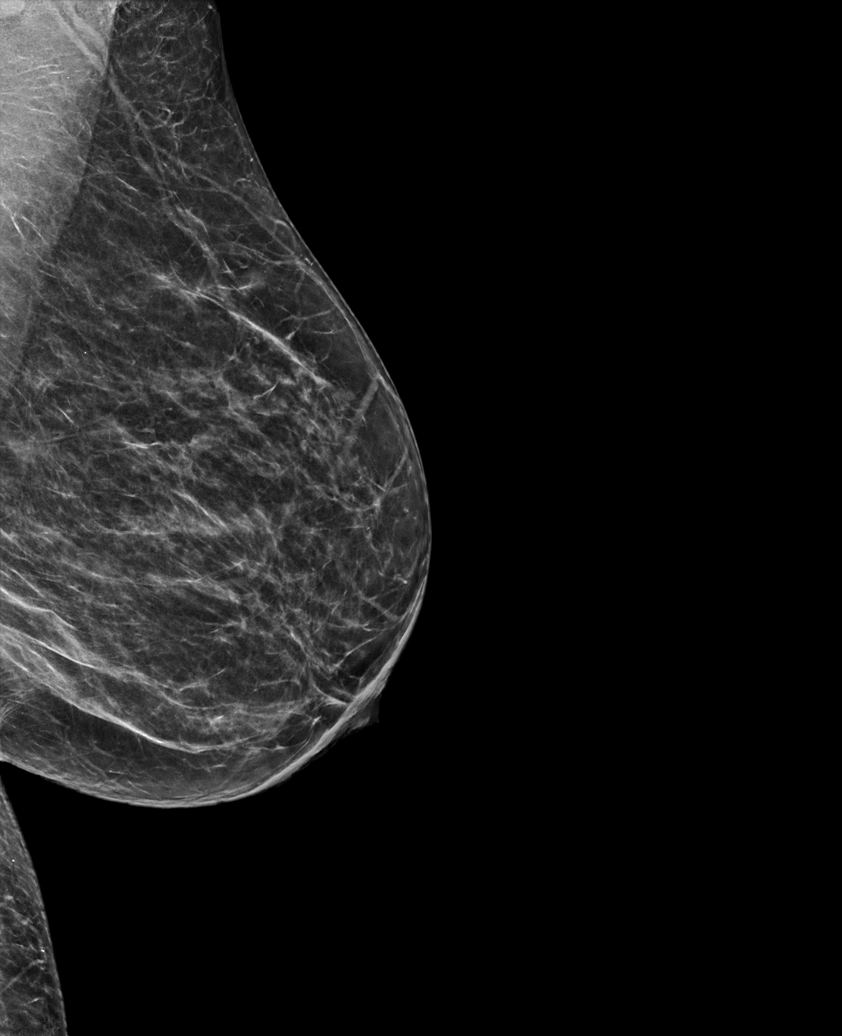

[R CC synth-2D (2 of 2)]
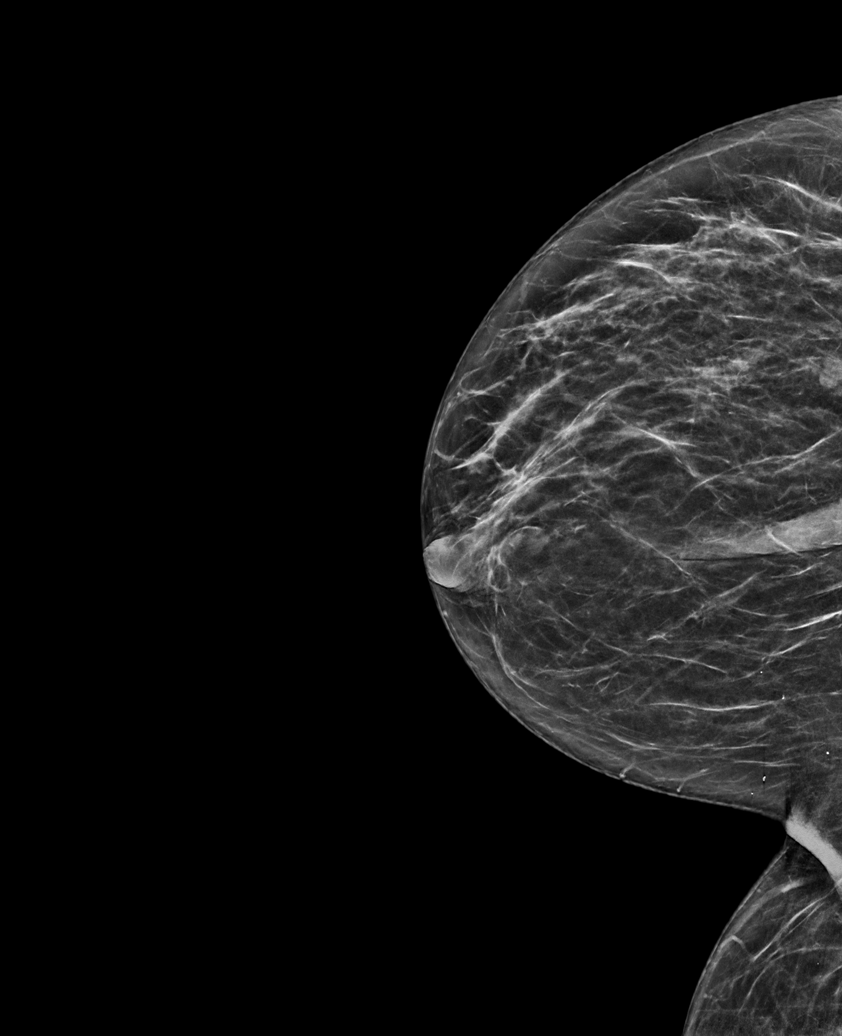

[L CC synth-2D]
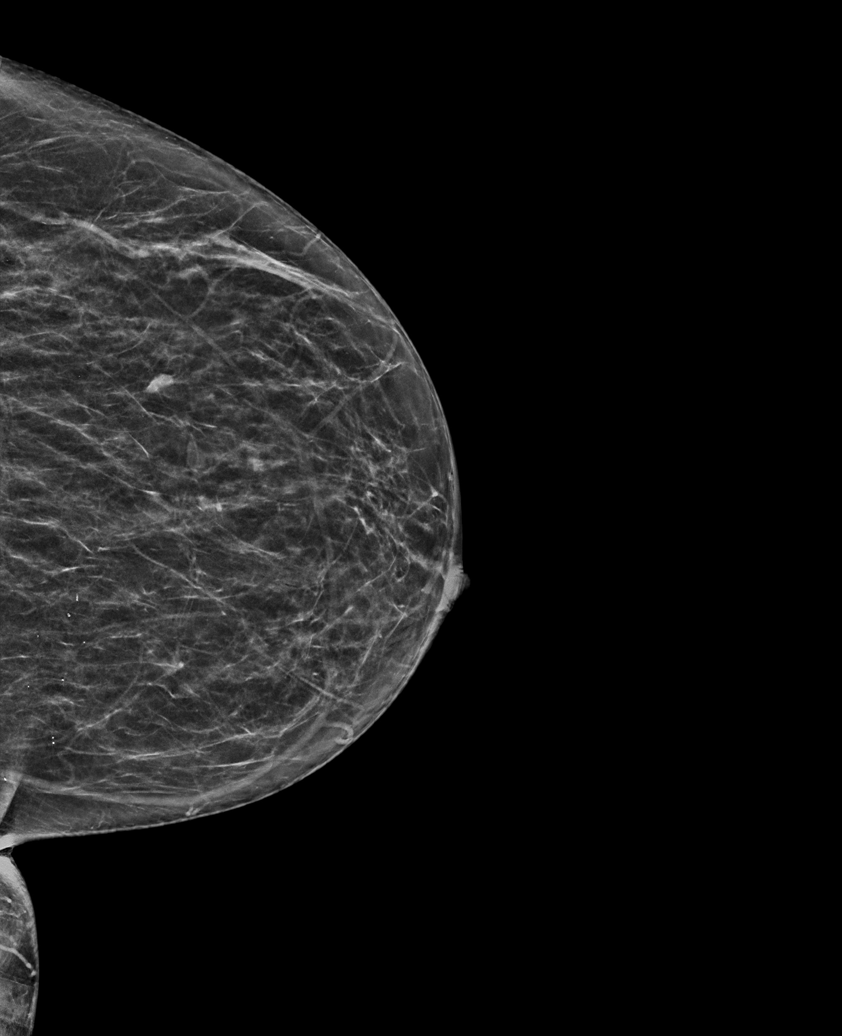

[R MLO synth-2D]
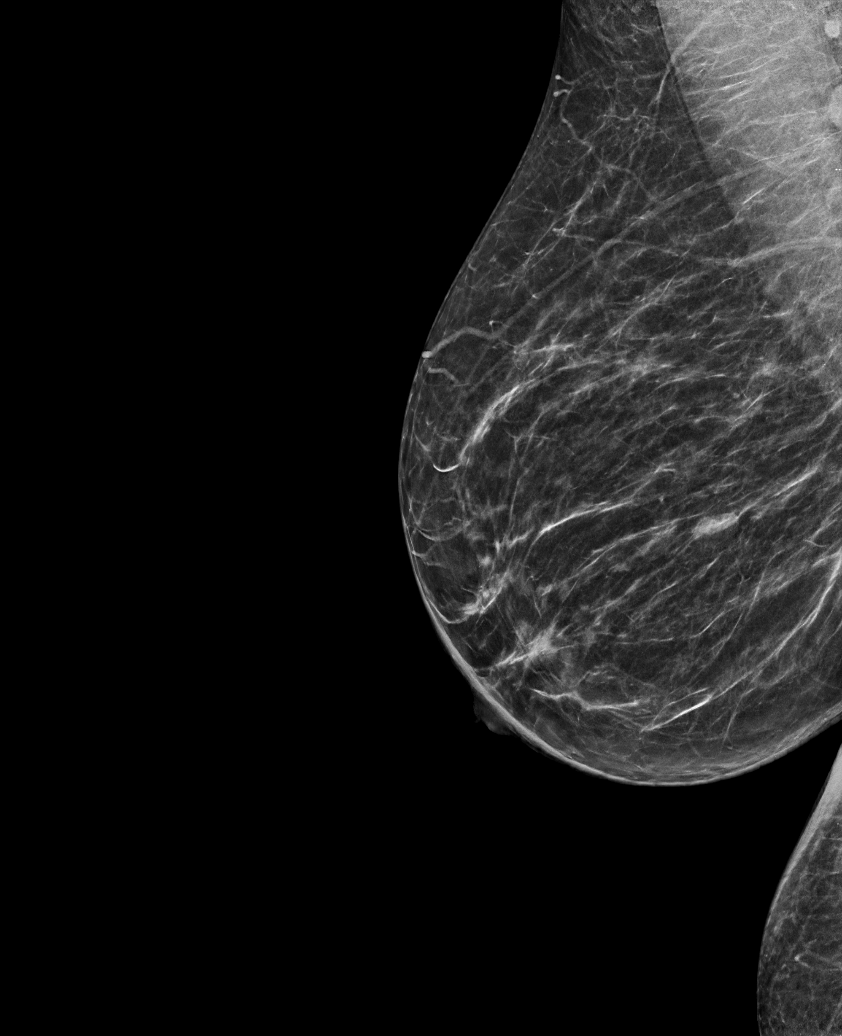

[R CC tomo · tomo slice 31/61.0]
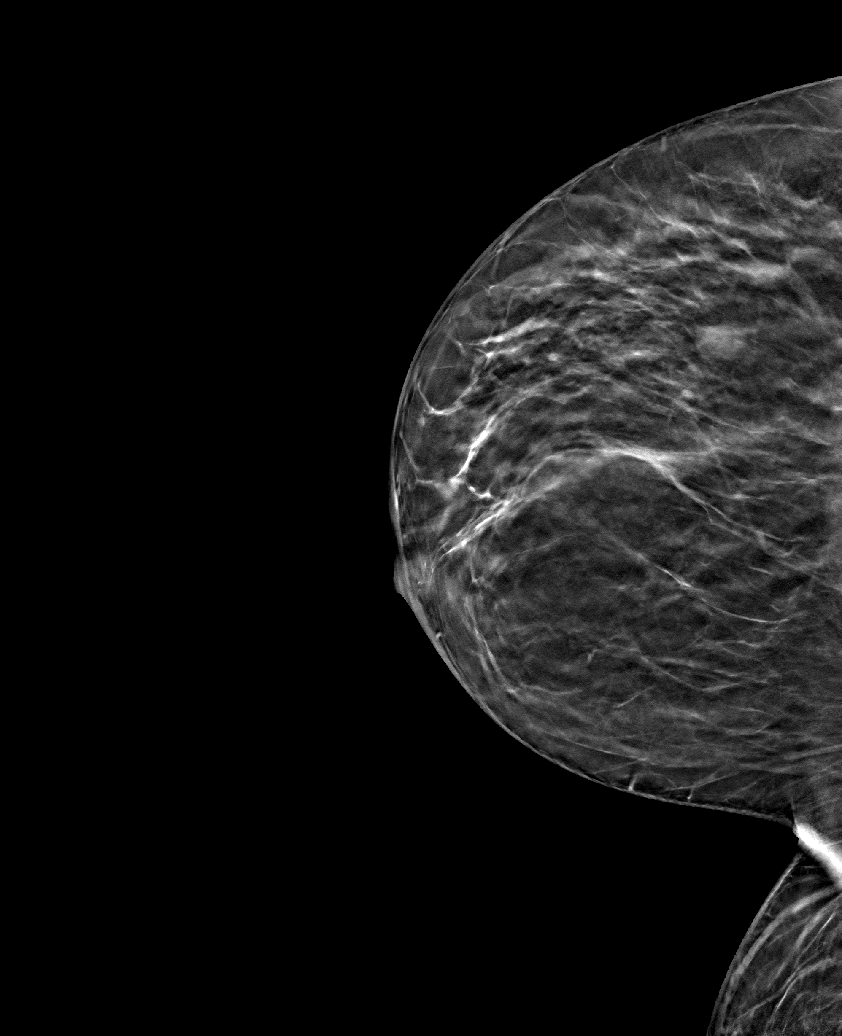

[6 of 30 positions shown; findings below may reference images not displayed]

ACR Breast Density Category b: There are scattered areas of
fibroglandular density.
FINDINGS: There are no findings suspicious for malignancy. Images were
processed with CAD.
IMPRESSION: No mammographic evidence of malignancy. A result letter of this
screening mammogram will be mailed directly to the patient.

RECOMMENDATION:
Screening mammogram in one year. (Code:[id number])

BI-RADS CATEGORY  1: Negative.

## 2022-07-20 ENCOUNTER — Encounter: Payer: Self-pay | Admitting: Family Medicine
